# Patient Record
Sex: Male | Born: 1950
Health system: Southern US, Community
[De-identification: ages and names within clinical notes are randomized; demographics above are authoritative.]

## PROBLEM LIST (undated history)

## (undated) DIAGNOSIS — E119 Type 2 diabetes mellitus without complications: Secondary | ICD-10-CM

## (undated) DIAGNOSIS — N189 Chronic kidney disease, unspecified: Secondary | ICD-10-CM

## (undated) DIAGNOSIS — M1990 Unspecified osteoarthritis, unspecified site: Secondary | ICD-10-CM

## (undated) DIAGNOSIS — Z8042 Family history of malignant neoplasm of prostate: Secondary | ICD-10-CM

## (undated) DIAGNOSIS — Z973 Presence of spectacles and contact lenses: Secondary | ICD-10-CM

## (undated) DIAGNOSIS — E785 Hyperlipidemia, unspecified: Secondary | ICD-10-CM

## (undated) DIAGNOSIS — R569 Unspecified convulsions: Secondary | ICD-10-CM

## (undated) DIAGNOSIS — K219 Gastro-esophageal reflux disease without esophagitis: Secondary | ICD-10-CM

## (undated) DIAGNOSIS — M722 Plantar fascial fibromatosis: Secondary | ICD-10-CM

## (undated) DIAGNOSIS — R339 Retention of urine, unspecified: Secondary | ICD-10-CM

## (undated) DIAGNOSIS — C61 Malignant neoplasm of prostate: Secondary | ICD-10-CM

## (undated) DIAGNOSIS — Z8489 Family history of other specified conditions: Secondary | ICD-10-CM

## (undated) DIAGNOSIS — E781 Pure hyperglyceridemia: Secondary | ICD-10-CM

## (undated) HISTORY — DX: Type 2 diabetes mellitus without complications: E11.9

## (undated) HISTORY — PX: COLONOSCOPY: SHX174

## (undated) HISTORY — DX: Family history of malignant neoplasm of prostate: Z80.42

## (undated) HISTORY — PX: TONSILLECTOMY: SUR1361

---

## 1986-04-19 HISTORY — PX: POLYPECTOMY: SHX149

## 2008-11-06 ENCOUNTER — Ambulatory Visit: Payer: Self-pay | Admitting: Thoracic Surgery

## 2008-11-17 HISTORY — PX: BRONCHOSCOPY: SUR163

## 2008-11-19 ENCOUNTER — Ambulatory Visit: Payer: Self-pay | Admitting: Thoracic Surgery

## 2008-11-19 ENCOUNTER — Ambulatory Visit (HOSPITAL_COMMUNITY): Admission: RE | Admit: 2008-11-19 | Discharge: 2008-11-19 | Payer: Self-pay | Admitting: Thoracic Surgery

## 2008-11-19 ENCOUNTER — Encounter: Payer: Self-pay | Admitting: Thoracic Surgery

## 2010-07-25 LAB — APTT: aPTT: 30 seconds (ref 24–37)

## 2010-07-25 LAB — COMPREHENSIVE METABOLIC PANEL
AST: 28 U/L (ref 0–37)
Albumin: 3.9 g/dL (ref 3.5–5.2)
Alkaline Phosphatase: 81 U/L (ref 39–117)
Chloride: 103 mEq/L (ref 96–112)
GFR calc Af Amer: >60 mL/min (ref >60)
Potassium: 5 mEq/L (ref 3.5–5.1)
Sodium: 140 mEq/L (ref 135–145)
Total Bilirubin: 0.6 mg/dL (ref 0.3–1.2)
Total Protein: 6.8 g/dL (ref 6.0–8.3)

## 2010-07-25 LAB — CULTURE, RESPIRATORY W GRAM STAIN

## 2010-07-25 LAB — CBC
Platelets: 198 10*3/uL (ref 150–400)
RDW: 12.1 % (ref 11.5–15.5)
WBC: 6.2 10*3/uL (ref 4.0–10.5)

## 2010-09-01 NOTE — Op Note (Signed)
[name], [name]             ACCOUNT NO.:  [id number]   MEDICAL RECORD NO.:  [id number]          PATIENT TYPE:  AMB   LOCATION:  SDS                          FACILITY:  [facility]   PHYSICIAN:  Hermelinda Medicus, M.D.   DATE OF BIRTH:  08-30-1950   DATE OF PROCEDURE:  DATE OF DISCHARGE:  11/19/2008                               OPERATIVE REPORT   PREOPERATIVE DIAGNOSES:  1. Possible true vocal cord lesion.  2. History of vocal cord polyp in 1988.  3. History of voice abuse.  4. History of hoarseness with hemoptysis episode.   POSTOPERATIVE DIAGNOSES:  1. Possible true vocal cord lesion.  2. History of vocal cord polyp in 1988.  3. History of voice abuse.  4. History of hoarseness with hemoptysis episode with findings of      anterior commissure hemorrhagic polyp, right anterior commissure      true vocal cord.   OPERATOR:  Hermelinda Medicus, MD   PROCEDURES:  1. Microlaryngoscopy.  2. True vocal cord biopsy excision.   ANESTHESIA:  General endotracheal with Dr. Katrinka Blazing.   PROCEDURE:  The patient was placed in the supine position under general  endotracheal anesthesia.  Using a small 6 endotracheal tube, the patient  was positioned and draped appropriately and then using the high-power  microscope as well as the Jako laryngoscope, the entire larynx was  evaluated.  We then focused down to the vocal cords, the area of  definite interest and placed a Lewy suspension and after evaluating the  posterior aspect of the vocal cords and the piriformis and the laryngeal  end lingual surface of the epiglottis, removed down to the vocal cords,  then subglottic region posteriorly looked within normal limits, but  anteriorly we were able to manipulate the larynx up to seeing the  anterior commissure completely where there was a small polyp  approximately 2 mm to 3 mm in size, which we excised and biopsy excised  that was quite hemorrhagic and went very well with caused the area of  hemoptysis.  Once this was completed, the scope was then carefully  removed.  Lateral pharyngeal walls and posterior pharyngeal walls, base  of tongue, and oral cavity were all clear of any lesion or mass.  Then,  the procedure was continued where continuing with this intubation, Dr.  Edwyna Shell used the flexible bronchoscope to evaluate the  lungs, which will be established in his dictation.  His follow up will  be in 4 days, then 2 weeks, then 6 weeks, and 6 months.  In 6 months, he  is going to need another CT scan of his chest just to checkout that  right lower lobe superior aspect to 5 mm lesion.           ______________________________  Hermelinda Medicus, M.D.     JC/MEDQ  D:  11/19/2008  T:  11/19/2008  Job:  [id number]   cc:   Ines Bloomer, M.D.

## 2010-09-01 NOTE — H&P (Signed)
[name], [name]             ACCOUNT NO.:  [id number]   MEDICAL RECORD NO.:  [id number]          PATIENT TYPE:  AMB   LOCATION:  SDS                          FACILITY:  [facility]   PHYSICIAN:  [name], M.D.   DATE OF BIRTH:  11-18-1950   DATE OF ADMISSION:  11/19/2008  DATE OF DISCHARGE:  11/19/2008                              HISTORY & PHYSICAL   This patient is a 60 year old male who had some hoarseness.  He has been  a Runner, broadcasting/film/video for many years.  He had a polyp removed under my care in 1988,  from his vocal cord and more recently he has come into the office with a  hoarseness once again.  He, on examination using indirect laryngoscopy  and the [facility] scope, we did not see any obvious concern, but  anteriorly it was very difficult to see and also there was concern and  there seemed to be some slight swelling in that area.  We felt we would  observe him, he did, then go to [place] where he had an episode of  hemoptysis and there was no bleeding from his nasopharynx, oropharynx,  or his nose.  He did not vomit any blood.  He does have thus spit up  some blood and then no further problem.  He did go to the emergency room  where they just told him to be further evaluated in [place].  Here,  he has a normal chest x-ray, but we ordered a CAT scan of his chest,  which showed lesion of 5 mm in the superior aspect, right lower lobe and  therefore we felt that with this we would do a direct laryngoscopy,  microlaryngoscopy, and a bronchoscopy with myself and Dr. Jovita Richardson.   PAST MEDICAL HISTORY:  1. He has hypercholesterolemia.  2. He has had a seizure disorder in the past.   He takes phenobarbital, Tri-Chlor, opiate, simvastatin, folic acid, baby  aspirin 81, Avandia twice a day, and a Prilosec.   He has had problems with reflux, but appears to be well under control.   FAMILY HISTORY:  Unremarkable.   ALLERGIES:  He has no allergies to medication.   SOCIAL HISTORY:   Married, has 2 children.  He is on moving to [employer] as  a Runner, broadcasting/film/video.   REVIEW OF SYSTEMS:  Unremarkable.  Otherwise, he had no past bleeding  problems or coagulation problems.   PHYSICAL EXAMINATION:  VITAL SIGNS:  Blood pressure of 111/74.  The  pulse is 88.  HEENT:  The ears are clear.  The tympanic membranes look excellent and  nose is completely clear as is the nasopharynx.  He has no evidence of  sinusitis, polyps, or mass.  The oral cavity is free also of any  ulceration or mass.  The larynx appears to have some thickening of his  vocal cords with some mild erythema typical of a person who has overused  his voice for many years, but no obvious masses.  However, the anterior  commissure is very difficult to see.  CHEST:  Clear.  No rales, rhonchi, or wheezes.  CARDIOVASCULAR:  Normal S1, S2.  No murmurs or gallops.  EXTREMITIES:  Unremarkable.   Initial diagnosis is that of possible anterior commissure or bronchial  lesion causing hemoptysis.  We will plan to do a microlaryngoscopy and  possible biopsy excision with also a flexible bronchoscopy as a combined  procedure.  We will also get a follow up CT scan in 6 months to check up  at right lower lobe.   OUR DIAGNOSES:  1. Possible lesion, anterior vocal cord.  2. Possible lesion, right lower lobe, superior aspect.  3. History of voice abuse.  4. History of seizure.  5. History of reflux esophagitis.  6. Cholesterolemia.           ______________________________  [name], M.D.     JC/MEDQ  D:  11/19/2008  T:  11/19/2008  Job:  [id number]

## 2010-09-01 NOTE — H&P (Signed)
NAMEMORDCHE, HEDGLIN             ACCOUNT NO.:  [id number]   MEDICAL RECORD NO.:  [id number]          PATIENT TYPE:  AMB   LOCATION:  SDS                          FACILITY:  [facility]   PHYSICIAN:  Hermelinda Medicus, M.D.   DATE OF BIRTH:  03-13-1951   DATE OF ADMISSION:  11/19/2008  DATE OF DISCHARGE:  11/19/2008                              HISTORY & PHYSICAL   He is under Dr. Karle Plumber and my care.   This patient is a 60 year old male who entered my office with some mild  hoarseness.  He has been a Runner, broadcasting/film/video for many many years.  He in 1988 had  a polyp removed under my care and he has not had any other problems.  He, however, after examination    DICTATION ENDED AT THIS POINT           ______________________________  Hermelinda Medicus, M.D.     JC/MEDQ  D:  11/19/2008  T:  11/19/2008  Job:  [id number]

## 2010-09-01 NOTE — Letter (Signed)
November 06, 2008   Hermelinda Medicus, MD  [address], [place] [address]   Re:  KYE, HEDDEN             DOB:  December 05, 1950   Dear Dr. Haroldine Laws:   I saw the patient today.  This patient in 1988 had a removal of a polyp  for some problems with a voice hoarseness.  He now seems to have in last  6 months to have more problems with hoarseness in the voice.  He works  as a Runner, broadcasting/film/video.  He was recently at a conference at [facility] where he had  an episode of possible hemoptysis versus some bleeding in his  nasopharynx.  There was no epistaxis and no hematemesis.  It cleared  easily, although he went to the emergency room and nothing was found.  He had a CT scan done at [facility], which showed a 5-mm right lower  lobe nodule with no other lesions and referred here for evaluation.   PAST MEDICAL HISTORY:  Significant for hypercholesterolemia, and he has  had a seizure disorder.   MEDICATIONS:  He takes phenobarbital 190 mg a day, TriCor, Optinate,  simvastatin, folic acid, baby aspirin, Avandia twice a day, and  Prilosec.  He has had problems with reflux.   FAMILY HISTORY:  Noncontributory.   ALLERGIES:  He has no allergies.   SOCIAL HISTORY:  He is married.  He has two children.  He works as a  Engineer, site.  He does not smoke or drink.   REVIEW OF SYSTEMS:  He is 6 feet 2.  He is 210 pounds.  General:  His  weight has been stable.  Cardiac:  No angina or atrial fibrillation.  Pulmonary:  See history of present illness.  GI:  Reflux and  constipation.  GU:  Frequent urination.  Vascular:  No claudication,  DVT, or TIAs.  Neurological:  Seizures.  No headaches or blackouts.  Musculoskeletal:  No arthritis or joint pain.  Psychiatric:  No  depression or nervousness.  Eyes/ENT:  No changes in his eyesight or  hearing.  Hematological:  No problems with bleeding, clotting disorders,  or anemia.   PHYSICAL EXAMINATION:  GENERAL:  He is a well-developed Caucasian male  in no  acute distress.  VITAL SIGNS:  His blood pressure was 124/64, pulse 88, respirations 20,  and saturations were 97%.  HEAD, EYES, EARS, NOSE, AND THROAT:  Unremarkable.  NECK:  Supple without thyromegaly.  There is no supraclavicular or  axillary adenopathy.  CHEST:  Clear to auscultation and percussion.  HEART:  Regular sinus rhythm.  No murmur.  ABDOMEN:  Soft.  There is no hepatosplenomegaly.  EXTREMITIES:  Pulses are 2+.  There is no clubbing or edema.  NEUROLOGICAL:  He is oriented x3.  Sensory and motor intact.   I am not sure whether this is a hemoptysis or bleeding from the pharynx.  I think given that it was a fairly significant hemoptysis, he probably  needs to have evaluation with an microlaryngoscopy and bronchoscopy.  I  have discussed this with he and his wife and they seemed agreeable to  this.  We will try to set this up after November 18, 2008.  I appreciate  the opportunity of seeing the patient.  I do recommend that he get a  followup CT scan for the 5-mm right lower lobe nodule in 6 months.   Sincerely,   Ines Bloomer, M.D.  Electronically  Signed   DPB/MEDQ  D:  11/06/2008  T:  11/07/2008  Job:  [id number]

## 2010-09-01 NOTE — Op Note (Signed)
NAMETHARON, BOMAR             ACCOUNT NO.:  [id number]   MEDICAL RECORD NO.:  [id number]          PATIENT TYPE:  AMB   LOCATION:  SDS                          FACILITY:  [facility]   PHYSICIAN:  Ines Bloomer, M.D. DATE OF BIRTH:  1951-02-24   DATE OF PROCEDURE:  DATE OF DISCHARGE:                               OPERATIVE REPORT   SURGEON:  Ines Bloomer, MD   OPERATION PERFORMED:  Fiberoptic bronchoscopy.   POSTOPERATIVE DIAGNOSIS:  Fiberoptic bronchoscopy.   ANESTHESIA:  General anesthesia.   A small 0.5 tube was passed through the endotracheal tube and Dr.  Haroldine Laws did a direct laryngoscopy with biopsy of an anterior commissure  polyp.  He will dictate that separately.  After this had been completed,  the 6-mm video bronchoscope was passed right next to the tube and then  reinflated after the scope had been passed, the distal trachea was  normal, the carina midline was normal.  The right upper lobe, right  middle lobe, and right lower lobe orifices were normal.  On the right  lower lobe where they had been some questionable infiltrate on the CT  scan that was completely normal particularly in the superior segment.  On the left side, the left mainstem bronchus was normal.  The left upper  lobe and left lower lobe orifices were normal.  No endobronchial lesion  was seen.  The cultures and cytologies were taken.  The video  bronchoscope was removed.  The patient tolerated procedure well, was  returned to recovery room in stable condition.      Ines Bloomer, M.D.  Electronically Signed     DPB/MEDQ  D:  11/19/2008  T:  11/19/2008  Job:  [id number]

## 2010-12-04 ENCOUNTER — Other Ambulatory Visit: Payer: Self-pay | Admitting: Rheumatology

## 2010-12-04 DIAGNOSIS — M25562 Pain in left knee: Secondary | ICD-10-CM

## 2010-12-05 ENCOUNTER — Ambulatory Visit
Admission: RE | Admit: 2010-12-05 | Discharge: 2010-12-05 | Disposition: A | Discharge status: Home / Self Care | Payer: BC Managed Care – PPO | Source: Ambulatory Visit | Attending: Rheumatology | Admitting: Rheumatology

## 2010-12-05 DIAGNOSIS — M25562 Pain in left knee: Secondary | ICD-10-CM

## 2011-08-18 HISTORY — PX: KNEE ARTHROSCOPY: SHX127

## 2012-07-24 ENCOUNTER — Ambulatory Visit: Payer: Self-pay | Admitting: Neurology

## 2012-08-15 ENCOUNTER — Telehealth: Payer: Self-pay | Admitting: Neurology

## 2012-08-15 ENCOUNTER — Other Ambulatory Visit: Payer: Self-pay

## 2012-08-15 MED ORDER — PHENOBARBITAL 64.8 MG PO TABS: 194.4000 mg | ORAL_TABLET | Freq: Every day | ORAL | Status: DC

## 2012-08-15 NOTE — Telephone Encounter (Signed)
Former Love patient assigned to Dr Yan  

## 2012-08-15 NOTE — Telephone Encounter (Signed)
His pharmacist called regarding a refill on his phenobarbital prescription. The patient is a former patient of Dr. love has an appointment with our office in June. He is running out of his medication and apparently was ordered by Dr. Terrace Arabia but the pharmacist did not receive it. I gave a verbal order for his phenobarbital prescription, one month supply with 2 refills to cover him until his appointment.

## 2012-09-04 ENCOUNTER — Other Ambulatory Visit: Payer: Self-pay | Admitting: Orthopedic Surgery

## 2012-09-26 ENCOUNTER — Encounter (HOSPITAL_BASED_OUTPATIENT_CLINIC_OR_DEPARTMENT_OTHER): Payer: Self-pay | Admitting: *Deleted

## 2012-09-26 NOTE — Progress Notes (Signed)
No cardiac or resp problems-no labs needed 

## 2012-10-02 ENCOUNTER — Encounter (HOSPITAL_BASED_OUTPATIENT_CLINIC_OR_DEPARTMENT_OTHER)
Admission: RE | Disposition: A | Discharge status: Home / Self Care | Payer: Self-pay | Source: Ambulatory Visit | Attending: Orthopedic Surgery

## 2012-10-02 ENCOUNTER — Ambulatory Visit (HOSPITAL_BASED_OUTPATIENT_CLINIC_OR_DEPARTMENT_OTHER): Payer: BC Managed Care – PPO | Admitting: Anesthesiology

## 2012-10-02 ENCOUNTER — Encounter (HOSPITAL_BASED_OUTPATIENT_CLINIC_OR_DEPARTMENT_OTHER): Payer: Self-pay | Admitting: Anesthesiology

## 2012-10-02 ENCOUNTER — Ambulatory Visit (HOSPITAL_BASED_OUTPATIENT_CLINIC_OR_DEPARTMENT_OTHER)
Admission: RE | Admit: 2012-10-02 | Discharge: 2012-10-02 | Disposition: A | Discharge status: Home / Self Care | Payer: BC Managed Care – PPO | Source: Ambulatory Visit | Attending: Orthopedic Surgery | Admitting: Orthopedic Surgery

## 2012-10-02 ENCOUNTER — Encounter (HOSPITAL_BASED_OUTPATIENT_CLINIC_OR_DEPARTMENT_OTHER): Payer: Self-pay | Admitting: *Deleted

## 2012-10-02 DIAGNOSIS — M7502 Adhesive capsulitis of left shoulder: Secondary | ICD-10-CM

## 2012-10-02 DIAGNOSIS — E785 Hyperlipidemia, unspecified: Secondary | ICD-10-CM | POA: Insufficient documentation

## 2012-10-02 DIAGNOSIS — M719 Bursopathy, unspecified: Secondary | ICD-10-CM | POA: Insufficient documentation

## 2012-10-02 DIAGNOSIS — K219 Gastro-esophageal reflux disease without esophagitis: Secondary | ICD-10-CM | POA: Insufficient documentation

## 2012-10-02 DIAGNOSIS — M7500 Adhesive capsulitis of unspecified shoulder: Secondary | ICD-10-CM | POA: Insufficient documentation

## 2012-10-02 DIAGNOSIS — M67919 Unspecified disorder of synovium and tendon, unspecified shoulder: Secondary | ICD-10-CM | POA: Insufficient documentation

## 2012-10-02 DIAGNOSIS — M66329 Spontaneous rupture of flexor tendons, unspecified upper arm: Secondary | ICD-10-CM | POA: Insufficient documentation

## 2012-10-02 DIAGNOSIS — M129 Arthropathy, unspecified: Secondary | ICD-10-CM | POA: Insufficient documentation

## 2012-10-02 HISTORY — DX: Unspecified osteoarthritis, unspecified site: M19.90

## 2012-10-02 HISTORY — DX: Pure hyperglyceridemia: E78.1

## 2012-10-02 HISTORY — DX: Presence of spectacles and contact lenses: Z97.3

## 2012-10-02 HISTORY — DX: Unspecified convulsions: R56.9

## 2012-10-02 HISTORY — DX: Gastro-esophageal reflux disease without esophagitis: K21.9

## 2012-10-02 HISTORY — DX: Hyperlipidemia, unspecified: E78.5

## 2012-10-02 HISTORY — PX: CAPSULAR RELEASE: SHX6293

## 2012-10-02 LAB — POCT HEMOGLOBIN-HEMACUE: Hemoglobin: 15.5 g/dL (ref 13.0–17.0)

## 2012-10-02 SURGERY — CAPSULAR RELEASE
Anesthesia: Regional | Site: Shoulder | Laterality: Left | Wound class: Clean

## 2012-10-02 MED ORDER — OXYCODONE HCL 5 MG/5ML PO SOLN: 5.0000 mg | Freq: Once | ORAL | Status: DC | PRN

## 2012-10-02 MED ORDER — SUCCINYLCHOLINE CHLORIDE 20 MG/ML IJ SOLN
INTRAMUSCULAR | Status: DC | PRN
  Administered 2012-10-02: 100 mg via INTRAVENOUS

## 2012-10-02 MED ORDER — FENTANYL CITRATE 0.05 MG/ML IJ SOLN
50.0000 ug | INTRAMUSCULAR | Status: DC | PRN
  Administered 2012-10-02: 100 ug via INTRAVENOUS

## 2012-10-02 MED ORDER — ONDANSETRON HCL 4 MG/2ML IJ SOLN
INTRAMUSCULAR | Status: DC | PRN
  Administered 2012-10-02: 4 mg via INTRAVENOUS

## 2012-10-02 MED ORDER — BUPIVACAINE-EPINEPHRINE PF 0.5-1:200000 % IJ SOLN
INTRAMUSCULAR | Status: DC | PRN
  Administered 2012-10-02: 30 mL

## 2012-10-02 MED ORDER — PROPOFOL 10 MG/ML IV BOLUS
INTRAVENOUS | Status: DC | PRN
  Administered 2012-10-02: 200 mg via INTRAVENOUS

## 2012-10-02 MED ORDER — LIDOCAINE HCL 4 % MT SOLN
OROMUCOSAL | Status: DC | PRN
  Administered 2012-10-02: 4 mL via TOPICAL

## 2012-10-02 MED ORDER — ONDANSETRON HCL 4 MG/2ML IJ SOLN: 4.0000 mg | Freq: Four times a day (QID) | INTRAMUSCULAR | Status: DC | PRN

## 2012-10-02 MED ORDER — SODIUM CHLORIDE 0.9 % IR SOLN
Status: DC | PRN
  Administered 2012-10-02: 6000 mL

## 2012-10-02 MED ORDER — HYDROMORPHONE HCL PF 1 MG/ML IJ SOLN: 0.2500 mg | INTRAMUSCULAR | Status: DC | PRN

## 2012-10-02 MED ORDER — MIDAZOLAM HCL 2 MG/2ML IJ SOLN
1.0000 mg | INTRAMUSCULAR | Status: DC | PRN
  Administered 2012-10-02: 2 mg via INTRAVENOUS

## 2012-10-02 MED ORDER — OXYCODONE HCL 5 MG PO TABS: 5.0000 mg | ORAL_TABLET | Freq: Once | ORAL | Status: DC | PRN

## 2012-10-02 MED ORDER — OXYCODONE-ACETAMINOPHEN 5-325 MG PO TABS: 1.0000 | ORAL_TABLET | ORAL | Status: DC | PRN

## 2012-10-02 MED ORDER — FENTANYL CITRATE 0.05 MG/ML IJ SOLN: 50.0000 ug | INTRAMUSCULAR | Status: DC | PRN

## 2012-10-02 MED ORDER — MIDAZOLAM HCL 2 MG/2ML IJ SOLN: 1.0000 mg | INTRAMUSCULAR | Status: DC | PRN

## 2012-10-02 MED ORDER — LIDOCAINE HCL (CARDIAC) 20 MG/ML IV SOLN
INTRAVENOUS | Status: DC | PRN
  Administered 2012-10-02: 80 mg via INTRAVENOUS

## 2012-10-02 MED ORDER — DEXAMETHASONE SODIUM PHOSPHATE 4 MG/ML IJ SOLN
INTRAMUSCULAR | Status: DC | PRN
  Administered 2012-10-02: 10 mg via INTRAVENOUS

## 2012-10-02 MED ORDER — LACTATED RINGERS IV SOLN
INTRAVENOUS | Status: DC
  Administered 2012-10-02 (×2): via INTRAVENOUS

## 2012-10-02 MED ORDER — CEFAZOLIN SODIUM-DEXTROSE 2-3 GM-% IV SOLR
2.0000 g | INTRAVENOUS | Status: AC
  Administered 2012-10-02: 2 g via INTRAVENOUS

## 2012-10-02 MED ORDER — POVIDONE-IODINE 7.5 % EX SOLN: Freq: Once | CUTANEOUS | Status: DC

## 2012-10-02 SURGICAL SUPPLY — 89 items
ADH SKN CLS APL DERMABOND .7 (GAUZE/BANDAGES/DRESSINGS)
APL SKNCLS STERI-STRIP NONHPOA (GAUZE/BANDAGES/DRESSINGS)
BENZOIN TINCTURE PRP APPL 2/3 (GAUZE/BANDAGES/DRESSINGS) IMPLANT
BLADE 4.2CUDA (BLADE) IMPLANT
BLADE CUDA 5.5 (BLADE) IMPLANT
BLADE CUTTER GATOR 3.5 (BLADE) IMPLANT
BLADE GREAT WHITE 4.2 (BLADE) IMPLANT
BLADE SURG 15 STRL LF DISP TIS (BLADE) IMPLANT
BLADE SURG 15 STRL SS (BLADE)
BLADE SURG ROTATE 9660 (MISCELLANEOUS) ×2 IMPLANT
BLADE VORTEX 6.0 (BLADE) IMPLANT
BUR 3.5 LG SPHERICAL (BURR) IMPLANT
BUR OVAL 4.0 (BURR) IMPLANT
BUR OVAL 6.0 (BURR) IMPLANT
BURR 3.5 LG SPHERICAL (BURR)
CANISTER OMNI JUG 16 LITER (MISCELLANEOUS) IMPLANT
CANISTER SUCTION 2500CC (MISCELLANEOUS) IMPLANT
CANNULA 5.75X71 LONG (CANNULA) ×2 IMPLANT
CANNULA TWIST IN 8.25X7CM (CANNULA) IMPLANT
CHLORAPREP W/TINT 26ML (MISCELLANEOUS) ×2 IMPLANT
CLOTH BEACON ORANGE TIMEOUT ST (SAFETY) ×2 IMPLANT
DECANTER SPIKE VIAL GLASS SM (MISCELLANEOUS) IMPLANT
DERMABOND ADVANCED (GAUZE/BANDAGES/DRESSINGS)
DERMABOND ADVANCED .7 DNX12 (GAUZE/BANDAGES/DRESSINGS) IMPLANT
DRAPE INCISE IOBAN 66X45 STRL (DRAPES) ×2 IMPLANT
DRAPE STERI 35X30 U-POUCH (DRAPES) ×2 IMPLANT
DRAPE SURG 17X23 STRL (DRAPES) ×2 IMPLANT
DRAPE U 20/CS (DRAPES) ×2 IMPLANT
DRAPE U-SHAPE 47X51 STRL (DRAPES) ×2 IMPLANT
DRAPE U-SHAPE 76X120 STRL (DRAPES) ×4 IMPLANT
DRSG PAD ABDOMINAL 8X10 ST (GAUZE/BANDAGES/DRESSINGS) ×2 IMPLANT
ELECT REM PT RETURN 9FT ADLT (ELECTROSURGICAL) ×2
ELECTRODE REM PT RTRN 9FT ADLT (ELECTROSURGICAL) ×1 IMPLANT
GAUZE SPONGE 4X4 16PLY XRAY LF (GAUZE/BANDAGES/DRESSINGS) IMPLANT
GAUZE XEROFORM 1X8 LF (GAUZE/BANDAGES/DRESSINGS) ×2 IMPLANT
GLOVE BIO SURGEON STRL SZ 6.5 (GLOVE) ×1 IMPLANT
GLOVE BIO SURGEON STRL SZ7 (GLOVE) ×2 IMPLANT
GLOVE BIO SURGEON STRL SZ7.5 (GLOVE) ×2 IMPLANT
GLOVE BIOGEL PI IND STRL 7.0 (GLOVE) ×1 IMPLANT
GLOVE BIOGEL PI IND STRL 8 (GLOVE) ×2 IMPLANT
GLOVE BIOGEL PI INDICATOR 7.0 (GLOVE) ×2
GLOVE BIOGEL PI INDICATOR 8 (GLOVE) ×1
GLOVE EXAM NITRILE LRG STRL (GLOVE) ×2 IMPLANT
GOWN PREVENTION PLUS XLARGE (GOWN DISPOSABLE) ×3 IMPLANT
NDL SCORPION MULTI FIRE (NEEDLE) IMPLANT
NDL SUT 6 .5 CRC .975X.05 MAYO (NEEDLE) IMPLANT
NEEDLE 1/2 CIR CATGUT .05X1.09 (NEEDLE) IMPLANT
NEEDLE MAYO TAPER (NEEDLE)
NEEDLE SCORPION MULTI FIRE (NEEDLE) IMPLANT
NS IRRIG 1000ML POUR BTL (IV SOLUTION) IMPLANT
PACK ARTHROSCOPY DSU (CUSTOM PROCEDURE TRAY) ×2 IMPLANT
PACK BASIN DAY SURGERY FS (CUSTOM PROCEDURE TRAY) ×2 IMPLANT
PENCIL BUTTON HOLSTER BLD 10FT (ELECTRODE) IMPLANT
RESECTOR FULL RADIUS 4.2MM (BLADE) ×2 IMPLANT
RESECTOR FULL RADIUS 4.8MM (BLADE) IMPLANT
SHEET MEDIUM DRAPE 40X70 STRL (DRAPES) IMPLANT
SLEEVE SCD COMPRESS KNEE MED (MISCELLANEOUS) ×2 IMPLANT
SLING ARM FOAM STRAP LRG (SOFTGOODS) ×1 IMPLANT
SLING ARM FOAM STRAP MED (SOFTGOODS) IMPLANT
SLING ARM FOAM STRAP XLG (SOFTGOODS) IMPLANT
SLING ARM IMMOBILIZER MED (SOFTGOODS) IMPLANT
SPONGE GAUZE 4X4 12PLY (GAUZE/BANDAGES/DRESSINGS) ×2 IMPLANT
SPONGE LAP 4X18 X RAY DECT (DISPOSABLE) IMPLANT
STRIP CLOSURE SKIN 1/2X4 (GAUZE/BANDAGES/DRESSINGS) IMPLANT
SUCTION FRAZIER TIP 10 FR DISP (SUCTIONS) IMPLANT
SUPPORT WRAP ARM LG (MISCELLANEOUS) ×2 IMPLANT
SUT BONE WAX W31G (SUTURE) IMPLANT
SUT ETHIBOND 2 OS 4 DA (SUTURE) IMPLANT
SUT ETHILON 3 0 PS 1 (SUTURE) ×2 IMPLANT
SUT ETHILON 4 0 PS 2 18 (SUTURE) IMPLANT
SUT FIBERWIRE #2 38 T-5 BLUE (SUTURE)
SUT FIBERWIRE 2-0 18 17.9 3/8 (SUTURE)
SUT MNCRL AB 3-0 PS2 18 (SUTURE) IMPLANT
SUT MNCRL AB 4-0 PS2 18 (SUTURE) IMPLANT
SUT PDS AB 0 CT 36 (SUTURE) IMPLANT
SUT PROLENE 3 0 PS 2 (SUTURE) IMPLANT
SUT VIC AB 0 CT1 18XCR BRD 8 (SUTURE) IMPLANT
SUT VIC AB 0 CT1 8-18 (SUTURE)
SUT VIC AB 2-0 SH 18 (SUTURE) IMPLANT
SUTURE FIBERWR #2 38 T-5 BLUE (SUTURE) IMPLANT
SUTURE FIBERWR 2-0 18 17.9 3/8 (SUTURE) IMPLANT
SYR BULB 3OZ (MISCELLANEOUS) IMPLANT
TOWEL OR 17X24 6PK STRL BLUE (TOWEL DISPOSABLE) ×2 IMPLANT
TOWEL OR NON WOVEN STRL DISP B (DISPOSABLE) ×2 IMPLANT
TUBE CONNECTING 20X1/4 (TUBING) ×4 IMPLANT
TUBING ARTHROSCOPY IRRIG 16FT (MISCELLANEOUS) ×2 IMPLANT
WAND STAR VAC 90 (SURGICAL WAND) ×2 IMPLANT
WATER STERILE IRR 1000ML POUR (IV SOLUTION) ×2 IMPLANT
YANKAUER SUCT BULB TIP NO VENT (SUCTIONS) IMPLANT

## 2012-10-02 NOTE — Progress Notes (Signed)
Assisted Dr. Hodierne with left, ultrasound guided, interscalene  block. Side rails up, monitors on throughout procedure. See vital signs in flow sheet. Tolerated Procedure well. 

## 2012-10-02 NOTE — Anesthesia Preprocedure Evaluation (Signed)
Anesthesia Evaluation  Patient identified by MRN, date of birth, ID band Patient awake    Reviewed: Allergy & Precautions, H&P , NPO status , Patient's Chart, lab work & pertinent test results  Airway Mallampati: II  Neck ROM: full    Dental   Pulmonary          Cardiovascular     Neuro/Psych Seizures -,     GI/Hepatic GERD-  ,  Endo/Other    Renal/GU      Musculoskeletal  (+) Arthritis -,   Abdominal   Peds  Hematology   Anesthesia Other Findings   Reproductive/Obstetrics                           Anesthesia Physical Anesthesia Plan  ASA: II  Anesthesia Plan: General and Regional   Post-op Pain Management: MAC Combined w/ Regional for Post-op pain   Induction: Intravenous  Airway Management Planned: Oral ETT  Additional Equipment:   Intra-op Plan:   Post-operative Plan: Extubation in OR  Informed Consent: I have reviewed the patients History and Physical, chart, labs and discussed the procedure including the risks, benefits and alternatives for the proposed anesthesia with the patient or authorized representative who has indicated his/her understanding and acceptance.     Plan Discussed with: CRNA, Anesthesiologist and Surgeon  Anesthesia Plan Comments:         Anesthesia Quick Evaluation

## 2012-10-02 NOTE — Anesthesia Procedure Notes (Addendum)
Anesthesia Regional Block:  Interscalene brachial plexus block  Pre-Anesthetic Checklist: ,, timeout performed, Correct Patient, Correct Site, Correct Laterality, Correct Procedure, Correct Position, site marked, Risks and benefits discussed,  Surgical consent,  Pre-op evaluation,  At surgeon's request and post-op pain management  Laterality: Left  Prep: chloraprep       Needles:  Injection technique: Single-shot  Needle Type: Echogenic Stimulator Needle     Needle Length: 5cm 5 cm Needle Gauge: 22 and 22 G    Additional Needles:  Procedures: ultrasound guided (picture in chart) and nerve stimulator Interscalene brachial plexus block  Nerve Stimulator or Paresthesia:  Response: biceps flexion, 0.45 mA,   Additional Responses:   Narrative:  Start time: 10/02/2012 11:01 AM End time: 10/02/2012 11:12 AM Injection made incrementally with aspirations every 5 mL.  Performed by: Personally  Anesthesiologist: Dr Chaney Malling  Additional Notes: Functioning IV was confirmed and monitors were applied.  A 50mm 22ga Arrow echogenic stimulator needle was used. Sterile prep and drape,hand hygiene and sterile gloves were used.  Negative aspiration and negative test dose prior to incremental administration of local anesthetic. The patient tolerated the procedure well.  Ultrasound guidance: relevent anatomy identified, needle position confirmed, local anesthetic spread visualized around nerve(s), vascular puncture avoided.  Image printed for medical record.   Interscalene brachial plexus block Procedure Name: Intubation Date/Time: 10/02/2012 11:59 AM Performed by: Burna Cash Pre-anesthesia Checklist: Patient identified, Emergency Drugs available, Suction available and Patient being monitored Patient Re-evaluated:Patient Re-evaluated prior to inductionOxygen Delivery Method: Circle System Utilized Preoxygenation: Pre-oxygenation with 100% oxygen Intubation Type: IV induction Ventilation:  Mask ventilation without difficulty Laryngoscope Size: Mac and 3 Grade View: Grade I Tube type: Oral Number of attempts: 1 Airway Equipment and Method: stylet and oral airway Placement Confirmation: ETT inserted through vocal cords under direct vision,  positive ETCO2 and breath sounds checked- equal and bilateral Secured at: 24 cm Tube secured with: Tape Dental Injury: Teeth and Oropharynx as per pre-operative assessment

## 2012-10-02 NOTE — Op Note (Signed)
Procedure(s): LEFT ARTHROSCOPY CAPSULAR RELEASE Procedure Note  [name] male 62 y.o. 10/02/2012  Procedure(s) and Anesthesia Type:    * LEFT ARTHROSCOPY CAPSULAR RELEASE and manipulation under anesthesia - General with preoperative interscalene block #2 left shoulder arthroscopic long head biceps tenotomy  Surgeon(s) and Role:    * Mable Paris, MD - Primary     Surgeon: Mable Paris   Assistants: Damita Lack PA-C [facility] was present and scrubbed throughout the procedure and was essential in positioning, assisting with the camera and instrumentation,, and closure)  Anesthesia: General endotracheal anesthesia with preoperative interscalene block      Procedure Detail  LEFT ARTHROSCOPY CAPSULAR RELEASE  Estimated Blood Loss: Min         Drains: none  Blood Given: none         Specimens: none        Complications:  * No complications entered in OR log *         Disposition: PACU - hemodynamically stable.         Condition: stable    Procedure:   INDICATIONS FOR SURGERY: The patient is 62 y.o. male who has had a refractory left adhesive capsulitis which has failed conservative management for nearly one year with physical therapy. He wished to go forward with arthroscopic capsular release with manipulation or anesthesia.  OPERATIVE FINDINGS: Examination under anesthesia: He had severe stiffness with only about 60 forward flexion, 40 abduction. External rotation was 0. Internal rotation in abduction was 0. Diagnostic Arthroscopy:  Glenoid articular cartilage: Intact Humeral head articular cartilage: Diffusely thinned but intact Labrum: Intact, he had some extensive tearing of the long head biceps tendon just off the superior labrum which was severely inflamed. Therefore an arthroscopic biceps tenotomy was carried out. Loose bodies: None Synovitis: Severe Articular sided rotator cuff: Appears to be intact   DESCRIPTION OF  PROCEDURE: The patient was identified in preoperative  holding area where I personally marked the operative site after  verifying site, side, and procedure with the patient. An interscalene block was given by the attending anesthesiologist the holding area.  The patient was taken back to the operating room where general anesthesia was induced without complication and was placed in the beach-chair position with the back  elevated about 60 degrees and all extremities and head and neck carefully padded and  positioned.   The left upper extremity was then prepped and  draped in a standard sterile fashion. The appropriate time-out  procedure was carried out. The patient did receive IV antibiotics  within 30 minutes of incision.   A small posterior portal incision was made and the arthroscope was introduced into the joint. An anterior portal was then established above the subscapularis using needle localization. Small cannula was placed anteriorly. Diagnostic arthroscopy was then carried out with findings as described above.  He is noted to have severe synovitis throughout the shoulder. The shoulder was extremely tight and difficult to get instrumentation into the joint. After the anterior portal was established it was pulled back slightly and the ArthroCare was used to start the anterior rotator interval release. The biceps tendon was noted to be severely synovitic and partially torn. This was felt to be a continued pain generator and lead to further continued inflammation and tightness. Therefore the ArthroCare was used to perform a release of the long head biceps tendon off of the superior labrum. The interval release was carried anterior to the posterior aspect of the coracoid which was skeletonized  released the coracohumeral ligament. The upper border of the subscapularis was identified and released. Was initially encased in severe scar tissue. The middle glenohumeral ligament was then divided just  behind the subscapularis and the capsular release was carried down to approximately the 5:00 position using a biter and shaver to carefully state just outside the capsule. The arthroscope was then placed anteriorly and the posterior capsule was viewed. The ArthroCare was used to carry the capsular release up over the superior labrum and then down posteriorly. The biter was used to separate the labrum until the muscular fibers of the infraspinatus are visualized. I can only carry the posterior release down to about the 9:00 position safely under direct visualization. This point the arthroscopic equipment was removed from the joint and the manipulation was carried out. For flexion there was a satisfying release of the axillary pouch was again for flexion. External rotation also satisfying release. The stroke shoulder was also manipulated into abduction an abducted external and internal rotation. After taking the shoulder through these range of motions carefully 3 times final range of motion was approximately 165 for flexion, external rotation to about 80, abduction to 120. In 90 of abduction he can't about 85 of external rotation 70 internal rotation.  The arthroscopic equipment was removed from the joint and the portals were closed with 3-0 nylon in an interrupted fashion. Sterile dressings were then applied including Xeroform 4 x 4's ABDs and tape. The patient was then allowed to awaken from general anesthesia, placed in a sling, transferred to the stretcher and taken to the recovery room in stable condition.   POSTOPERATIVE PLAN: The patient will be discharged home today and will followup in one week for suture removal and wound check.  He'll start physical therapy tomorrow and will work on daily, hourly exercises to prevent this from scarring back in.

## 2012-10-02 NOTE — Anesthesia Postprocedure Evaluation (Signed)
  Anesthesia Post-op Note  Patient: [name]  Procedure(s) Performed: Procedure(s): LEFT ARTHROSCOPY CAPSULAR RELEASE (Left)  Patient Location: PACU  Anesthesia Type:GA combined with regional for post-op pain  Level of Consciousness: awake, alert  and oriented  Airway and Oxygen Therapy: Patient Spontanous Breathing  Post-op Pain: none  Post-op Assessment: Post-op Vital signs reviewed  Post-op Vital Signs: Reviewed  Complications: No apparent anesthesia complications

## 2012-10-02 NOTE — Transfer of Care (Signed)
Immediate Anesthesia Transfer of Care Note  Patient: [name]  Procedure(s) Performed: Procedure(s): LEFT ARTHROSCOPY CAPSULAR RELEASE (Left)  Patient Location: PACU  Anesthesia Type:GA combined with regional for post-op pain  Level of Consciousness: awake, alert  and oriented  Airway & Oxygen Therapy: Patient Spontanous Breathing and Patient connected to face mask oxygen  Post-op Assessment: Report given to PACU RN and Post -op Vital signs reviewed and stable  Post vital signs: Reviewed and stable  Complications: No apparent anesthesia complications

## 2012-10-02 NOTE — H&P (Signed)
[name] is an 62 y.o. male.   Chief Complaint: Left shoulder stiffness HPI: 62 year-old male with 11 month history of worsening left shoulder stiffness with adhesive capsulitis which has been refractory to conservative management with extensive physical therapy.  Past Medical History  Diagnosis Date  . GERD (gastroesophageal reflux disease)   . Seizures     started age 23-last one 69  . Hyperlipemia   . Hypertriglyceridemia   . Wears glasses   . Arthritis     Past Surgical History  Procedure Laterality Date  . Tonsillectomy    . Knee arthroscopy  5/13    lt  . Bronchoscopy  8/10    removal vocal cord polyp  . Polypectomy  1988    vocal cords  . Colonoscopy      History reviewed. No pertinent family history. Social History:  reports that he has never smoked. He does not have any smokeless tobacco history on file. He reports that he does not drink alcohol or use illicit drugs.  Allergies: No Known Allergies  Medications Prior to Admission  Medication Sig Dispense Refill  . aspirin 81 MG tablet Take 81 mg by mouth daily.      . calcium carbonate (OS-CAL) 600 MG TABS Take 600 mg by mouth 2 (two) times daily with a meal.      . Dexlansoprazole 30 MG capsule Take 30 mg by mouth daily.      . fenofibrate (TRICOR) 145 MG tablet Take 145 mg by mouth daily.      . folic acid (FOLVITE) 800 MCG tablet Take 400 mcg by mouth daily.      Marland Kitchen lubiprostone (AMITIZA) 24 MCG capsule Take 24 mcg by mouth 2 (two) times daily with a meal.      . PHENobarbital (LUMINAL) 64.8 MG tablet Take 3 tablets (194.4 mg total) by mouth at bedtime.  90 tablet  5  . simvastatin (ZOCOR) 20 MG tablet Take 20 mg by mouth daily.        Results for orders placed during the hospital encounter of 10/02/12 (from the past 48 hour(s))  POCT HEMOGLOBIN-HEMACUE     Status: None   Collection Time    10/02/12 10:31 AM      Result Value Range   Hemoglobin 15.5  13.0 - 17.0 g/dL   No results  found.  Review of Systems  All other systems reviewed and are negative.    Blood pressure 99/62, pulse 73, temperature 97.7 F (36.5 C), temperature source Oral, resp. rate 12, height 6' 1.5" (1.867 m), weight 95.255 kg (210 lb), SpO2 99.00%. Physical Exam  Constitutional: He is oriented to person, place, and time. He appears well-developed and well-nourished.  HENT:  Head: Atraumatic.  Eyes: EOM are normal.  Cardiovascular: Intact distal pulses.   Respiratory: Effort normal.  Musculoskeletal:       Right shoulder: He exhibits decreased range of motion.  Neurological: He is alert and oriented to person, place, and time.  Skin: Skin is warm and dry.  Psychiatric: He has a normal mood and affect.     Assessment/Plan Left refractory frozen shoulder Plan arthroscopic capsular release and manipulation under anesthesia Risks / benefits of surgery discussed Consent on chart  NPO for OR Preop antibiotics   Mayfield Schoene WILLIAM 10/02/2012, 11:40 AM

## 2012-10-03 ENCOUNTER — Encounter (HOSPITAL_BASED_OUTPATIENT_CLINIC_OR_DEPARTMENT_OTHER): Payer: Self-pay | Admitting: Orthopedic Surgery

## 2012-10-09 ENCOUNTER — Encounter: Payer: Self-pay | Admitting: Neurology

## 2012-10-09 ENCOUNTER — Ambulatory Visit (INDEPENDENT_AMBULATORY_CARE_PROVIDER_SITE_OTHER): Payer: BC Managed Care – PPO | Admitting: Neurology

## 2012-10-09 VITALS — BP 116/78 | HR 86 | Ht 70.5 in | Wt 211.0 lb

## 2012-10-09 DIAGNOSIS — M19012 Primary osteoarthritis, left shoulder: Secondary | ICD-10-CM

## 2012-10-09 DIAGNOSIS — R569 Unspecified convulsions: Secondary | ICD-10-CM | POA: Insufficient documentation

## 2012-10-09 DIAGNOSIS — M19019 Primary osteoarthritis, unspecified shoulder: Secondary | ICD-10-CM

## 2012-10-09 MED ORDER — LEVETIRACETAM ER 750 MG PO TB24: 750.0000 mg | ORAL_TABLET | Freq: Every evening | ORAL | Status: DC

## 2012-10-09 NOTE — Patient Instructions (Signed)
Phenobarbital 62.8mg   2 tabs now x 2 weeks, One tab x 2 weeks. 1/2 tab x 2 weeks.

## 2012-10-09 NOTE — Progress Notes (Signed)
History of Present Illness:    [name] is a 62 year old right-handed white married male from [place], [place], he has a history of generalized major motor seizures beginning in 1968. He was admitted to [facility] in 10/1966 with a cerebral concussion secondary to trauma and at that time he also had evidence of a seizure. He had a spinal tap with opening pressure 130 mm of water,clear colorless CSF,protein of 40-50 mg percent, 2 white blood cells present, SMA-12 and skull x-rays which were unremarkable. EEG was  abnormal showing  interictal transient activity suggestive of epilepsy and he was placed on phenobarbital 100 mg daily.  He had repeat EEGs  08/15/67, 08/06/1966, and 06/06/94, in all of which were normal. His seizures began at age 70 and  occurred in bed from midnight until 6 AM. He was evaluated for suspected attention deficit disorder in 1995. He was placed on Ritalin. He has had simple tics and benign essential tremor.   He denies macropsia, macropsia, strange odors or tastes. His last seizure was in the 1980s.   He has had DEXA scans, last one was in 09/21/04,  showing normal bone density except some osteopenia of the femoral neck. He has never had an MRI scan of the brain. He has not had any side effects from his phenobarbital. His last in this office in 08/02/2001 showed normal CBC,CMP with elevated SGPT of 48, and  phenobarbital 17.8.He is independent in his activities of daily living. he has stiffness in his knees from arthritis and Dupytren's contractures in his hands. He exercises by swimming or riding a bicycle. He is independent in activities of daily living.    He had left frozen shoulder, with limited range of motion of his left shoulder joints,  Review of Systems  Out of a complete 14 system review, the patient complains of only the following symptoms, and all other reviewed systems are negative.   Constitutional:   N/A Cardiovascular:  Swelling in  legs Ear/Nose/Throat:  N/A Skin: N/A Eyes: N/A Respiratory: N/A Gastroitestinal: constiptation    Hematology/Lymphatic:  N/A Endocrine:  N/A Musculoskeletal: joints pain, joint swelling Allergy/Immunology: N/A Neurological: seizure Psychiatric:    N/A   PHYSICAL EXAMINATOINS:  Generalized: In no acute distress  Neck: Supple, no carotid bruits   Cardiac: Regular rate rhythm  Pulmonary: Clear to auscultation bilaterally  Musculoskeletal: No deformity  Neurological examination  Mentation: Alert oriented to time, place, history taking, and causual conversation  Cranial nerve II-XII: Pupils were equal round reactive to light extraocular movements were full, visual field were full on confrontational test. facial sensation and strength were normal. hearing was intact to finger rubbing bilaterally. Uvula tongue midline.  head turning and shoulder shrug and were normal and symmetric.Tongue protrusion into cheek strength was normal.  Motor: limited range of motion of left shoulder, no significant weakness.  Sensory: mildly length dependent fine touch, pinprick to above ankle level, preserved toe vibratory sensation, and proprioception   Coordination: Normal finger to nose, heel-to-shin bilaterally there was no truncal ataxia  Gait: Rising up from seated position without assistance, normal stance, without trunk ataxia, moderate stride,   Romberg signs: Negative  Deep tendon reflexes: Brachioradialis 2/2, biceps 2/2, triceps 2/2, patellar 2/2, Achilles 1/1, plantar responses were flexor bilaterally.   Assessment and Plan: 62 years old Caucasian male, with past medical history of hyperlipidemia, skin cancer, nocturnal generalized seizure, is on long-standing phenobarbital treatment, developed mild length dependent sensory changes, he wants to try different antiepileptic  medications.  1. we will tapering off phenobarbital in next few weeks, 2. keppra xr 750mg  qhs. 3. RTC in one  year

## 2012-10-16 ENCOUNTER — Telehealth: Payer: Self-pay | Admitting: Neurology

## 2012-10-16 MED ORDER — PHENOBARBITAL 64.8 MG PO TABS: ORAL_TABLET | ORAL | Status: DC

## 2012-10-16 NOTE — Telephone Encounter (Signed)
OV says:  Patient Instructions    Phenobarbital 62.8mg   2 tabs now x 2 weeks,  One tab x 2 weeks.  1/2 tab x 2 weeks.   Rx has been updated and sent.

## 2013-10-09 ENCOUNTER — Ambulatory Visit (INDEPENDENT_AMBULATORY_CARE_PROVIDER_SITE_OTHER): Payer: BC Managed Care – PPO | Admitting: Neurology

## 2013-10-09 ENCOUNTER — Encounter: Payer: Self-pay | Admitting: Neurology

## 2013-10-09 VITALS — BP 120/81 | HR 79 | Ht 72.0 in | Wt 213.0 lb

## 2013-10-09 DIAGNOSIS — R569 Unspecified convulsions: Secondary | ICD-10-CM

## 2013-10-09 MED ORDER — LEVETIRACETAM ER 750 MG PO TB24
750.0000 mg | ORAL_TABLET | Freq: Every evening | ORAL | Status: DC
Start: 2013-10-09 — End: 2014-10-16

## 2013-10-09 NOTE — Progress Notes (Signed)
History of Present Illness:    [name] is a 63 year-old right-handed white married male from [place], [place], he has a history of generalized major motor seizures beginning in 1968. He was admitted to [facility] in 10/1966 with a cerebral concussion secondary to trauma and at that time he also had evidence of a seizure. He had a spinal tap with opening pressure 130 mm of water,clear colorless CSF,protein of 40-50 mg percent, 2 white blood cells present, SMA-12 and skull x-rays which were unremarkable. EEG was  abnormal showing  interictal transient activity suggestive of epilepsy and he was placed on phenobarbital 100 mg daily.  He had repeat EEGs  08/15/67, 08/06/1966, and 06/06/94, in all of which were normal. His seizures began at age 63 and  occurred in bed from midnight until 6 AM. He was evaluated for suspected attention deficit disorder in 1995. He was placed on Ritalin. He has had simple tics and benign essential tremor.   He denies macropsia, macropsia, strange odors or tastes. His last seizure was in the 1980s.   He has had DEXA scans, last one was in 09/21/04,  showing normal bone density except some osteopenia of the femoral neck. He has never had an MRI scan of the brain. He has not had any side effects from his phenobarbital. His last in this office in 08/02/2001 showed normal CBC,CMP with elevated SGPT of 48, and  phenobarbital 17.8.He is independent in his activities of daily living. he has stiffness in his knees from arthritis and Dupytren's contractures in his hands. He exercises by swimming or riding a bicycle. He is independent in activities of daily living.    He had left frozen shoulder, with limited range of motion of his left shoulder joints,  UPDATE June 23rd 2015: He was switched to [place] xr 750mg  qhs from phenobarbital,  last seizure was Feb 1968.  He has been on phenobarbital since 1968. He has a lifelong history of flatfeet, mild gait difficulty, no  significant change,   Review of Systems  Out of a complete 14 system review, the patient complains of only the following symptoms, and all other reviewed systems are negative.  Rectal bleeding, seizure, joint pain, joint swelling, aching muscles.   PHYSICAL EXAMINATOINS:  Generalized: In no acute distress  Neck: Supple, no carotid bruits   Cardiac: Regular rate rhythm  Pulmonary: Clear to auscultation bilaterally  Musculoskeletal: No deformity  Neurological examination  Mentation: Alert oriented to time, place, history taking, and causual conversation  Cranial nerve II-XII: Pupils were equal round reactive to light extraocular movements were full, visual field were full on confrontational test. facial sensation and strength were normal. hearing was intact to finger rubbing bilaterally. Uvula tongue midline.  head turning and shoulder shrug and were normal and symmetric.Tongue protrusion into cheek strength was normal.  Motor: limited range of motion of left shoulder, no significant weakness.  Sensory: mildly length dependent fine touch, pinprick to above ankle level, preserved toe vibratory sensation, and proprioception   Coordination: Normal finger to nose, heel-to-shin bilaterally there was no truncal ataxia  Gait: Rising up from seated position without assistance, normal stance, without trunk ataxia, moderate stride,   Romberg signs: Negative  Deep tendon reflexes: Brachioradialis 2/2, biceps 2/2, triceps 2/2, patellar 2/2, Achilles 1/1, plantar responses were flexor bilaterally.   Assessment and Plan: 63 years old Caucasian male, with past medical history of hyperlipidemia, skin cancer, nocturnal generalized seizure, is on long-standing phenobarbital treatment, developed mild length  dependent sensory changes, he wants to try different antiepileptic medications.   keppra xr 750mg  qhs.  RTC as needed

## 2014-02-21 ENCOUNTER — Other Ambulatory Visit: Payer: Self-pay | Admitting: Rheumatology

## 2014-02-21 ENCOUNTER — Ambulatory Visit
Admission: RE | Admit: 2014-02-21 | Discharge: 2014-02-21 | Disposition: A | Discharge status: Home / Self Care | Payer: BC Managed Care – PPO | Source: Ambulatory Visit | Attending: Rheumatology | Admitting: Rheumatology

## 2014-02-21 DIAGNOSIS — R1031 Right lower quadrant pain: Secondary | ICD-10-CM

## 2014-07-16 ENCOUNTER — Other Ambulatory Visit: Payer: Self-pay | Admitting: Orthopedic Surgery

## 2014-07-16 ENCOUNTER — Ambulatory Visit
Admission: RE | Admit: 2014-07-16 | Discharge: 2014-07-16 | Disposition: A | Discharge status: Home / Self Care | Payer: BC Managed Care – PPO | Source: Ambulatory Visit | Attending: Orthopedic Surgery | Admitting: Orthopedic Surgery

## 2014-07-16 ENCOUNTER — Other Ambulatory Visit: Payer: BC Managed Care – PPO

## 2014-07-16 DIAGNOSIS — M25551 Pain in right hip: Secondary | ICD-10-CM

## 2014-09-03 DIAGNOSIS — M21862 Other specified acquired deformities of left lower leg: Secondary | ICD-10-CM | POA: Insufficient documentation

## 2014-09-03 DIAGNOSIS — M2142 Flat foot [pes planus] (acquired), left foot: Secondary | ICD-10-CM | POA: Insufficient documentation

## 2014-09-03 DIAGNOSIS — M76829 Posterior tibial tendinitis, unspecified leg: Secondary | ICD-10-CM | POA: Insufficient documentation

## 2014-10-16 ENCOUNTER — Other Ambulatory Visit: Payer: Self-pay | Admitting: Neurology

## 2015-03-20 ENCOUNTER — Other Ambulatory Visit: Payer: Self-pay | Admitting: Orthopedic Surgery

## 2015-03-20 DIAGNOSIS — R52 Pain, unspecified: Secondary | ICD-10-CM

## 2015-04-07 ENCOUNTER — Other Ambulatory Visit: Payer: BC Managed Care – PPO

## 2015-04-07 ENCOUNTER — Ambulatory Visit
Admission: RE | Admit: 2015-04-07 | Discharge: 2015-04-07 | Disposition: A | Discharge status: Home / Self Care | Payer: BC Managed Care – PPO | Source: Ambulatory Visit | Attending: Orthopedic Surgery | Admitting: Orthopedic Surgery

## 2015-04-07 DIAGNOSIS — R52 Pain, unspecified: Secondary | ICD-10-CM

## 2015-11-18 ENCOUNTER — Other Ambulatory Visit: Payer: BC Managed Care – PPO

## 2015-11-18 ENCOUNTER — Ambulatory Visit
Admission: RE | Admit: 2015-11-18 | Discharge: 2015-11-18 | Disposition: A | Discharge status: Home / Self Care | Payer: BC Managed Care – PPO | Source: Ambulatory Visit | Attending: Orthopedic Surgery | Admitting: Orthopedic Surgery

## 2015-11-18 DIAGNOSIS — R52 Pain, unspecified: Secondary | ICD-10-CM

## 2017-09-19 HISTORY — PX: TRANSRECTAL ULTRASOUND: SHX5146

## 2017-09-19 HISTORY — PX: BIOPSY PROSTATE: PRO28

## 2017-09-30 ENCOUNTER — Encounter: Payer: Self-pay | Admitting: Radiation Oncology

## 2017-09-30 DIAGNOSIS — H00019 Hordeolum externum unspecified eye, unspecified eyelid: Secondary | ICD-10-CM | POA: Diagnosis not present

## 2017-09-30 DIAGNOSIS — H579 Unspecified disorder of eye and adnexa: Secondary | ICD-10-CM | POA: Diagnosis not present

## 2017-10-13 ENCOUNTER — Encounter: Payer: Self-pay | Admitting: Radiation Oncology

## 2017-10-14 NOTE — Progress Notes (Signed)
Radiation Oncology         [phone]) [phone] ________________________________  Initial outpatient Consultation  Name: [name] MRN: [id number]  Date: 10/17/2017  DOB: 01-27-1951  DD:UKGUR, Herbie Baltimore, MD  Ardis Hughs, MD   REFERRING PHYSICIAN: Ardis Hughs, MD  DIAGNOSIS: 67 y.o. gentleman with unfavorable, intermediate risk, Stage T1c adenocarcinoma of the prostate with Gleason Score of 4+3, and PSA of 4.65    ICD-10-CM   1. Malignant neoplasm of prostate ([place]) C61   2. Family history of cancer Z80.9     HISTORY OF PRESENT ILLNESS: [name] is a 67 y.o. male with a diagnosis of prostate cancer. He was noted to have an elevated PSA of 4.65 by his primary care physician, Dr. Maury Dus, MD. Previous PSA were 4.84 on 04/11/2017, 4.1 in 01/2017, 3.5 in 12/2015 and 2.6 in 11/2014. Accordingly, he was referred for evaluation in urology by Dr. Louis Meckel, Viona Gilmore, MD on 09/19/2017,  digital rectal examination was performed at that time revealing no nodules.  The patient proceeded to transrectal ultrasound with 12 biopsies of the prostate on 09/19/2017.  The prostate volume measured 31 grams.  Out of 12 core biopsies, 3 were positive.  The maximum Gleason score was 4+3=7, and this was seen in Left Base Lateral.  The patient reviewed the biopsy results with his urologist and he has kindly been referred today for discussion of potential radiation treatment options.   PREVIOUS RADIATION THERAPY: No  PAST MEDICAL HISTORY:  Past Medical History:  Diagnosis Date  . Arthritis   . GERD (gastroesophageal reflux disease)   . Hyperlipemia   . Hypertriglyceridemia   . Prostate cancer ([place])   . Seizures ([place])    started age 68-last one 61  . Wears glasses       PAST SURGICAL HISTORY: Past Surgical History:  Procedure Laterality Date  . BIOPSY PROSTATE  09/19/2017  . BRONCHOSCOPY  8/10   removal vocal cord polyp  . CAPSULAR RELEASE Left 10/02/2012   Procedure: LEFT  ARTHROSCOPY CAPSULAR RELEASE;  Surgeon: Nita Sells, MD;  Location: [facility];  Service: Orthopedics;  Laterality: Left;  . COLONOSCOPY    . KNEE ARTHROSCOPY  5/13   lt  . POLYPECTOMY  1988   vocal cords  . TONSILLECTOMY    . TRANSRECTAL ULTRASOUND  09/19/2017    FAMILY HISTORY:  Family History  Problem Relation Age of Onset  . Arthritis Mother   . Dementia Mother   . Cancer - Prostate Father   . Cancer - Prostate Brother     SOCIAL HISTORY:  Social History   Socioeconomic History  . Marital status: Married    Spouse name: Barnett Applebaum  . Number of children: 2  . Years of education: college  . Highest education level: Not on file  Occupational History  . Occupation: Pharmacist, hospital     Comment: retired  Scientific laboratory technician  . Financial resource strain: Not on file  . Food insecurity:    Worry: Not on file    Inability: Not on file  . Transportation needs:    Medical: Not on file    Non-medical: Not on file  Tobacco Use  . Smoking status: Former Smoker    Packs/day: 0.50    Years: 20.00    Pack years: 10.00    Types: Cigarettes    Last attempt to quit: 04/19/2009    Years since quitting: 8.5  . Smokeless tobacco: Never Used  . Tobacco comment:  quit 67 years old  Substance and Sexual Activity  . Alcohol use: No  . Drug use: No  . Sexual activity: Not Currently  Lifestyle  . Physical activity:    Days per week: Not on file    Minutes per session: Not on file  . Stress: Not on file  Relationships  . Social connections:    Talks on phone: Not on file    Gets together: Not on file    Attends religious service: Not on file    Active member of club or organization: Not on file    Attends meetings of clubs or organizations: Not on file    Relationship status: Not on file  . Intimate partner violence:    Fear of current or ex partner: Not on file    Emotionally abused: Not on file    Physically abused: Not on file    Forced sexual activity: Not on file   Other Topics Concern  . Not on file  Social History Narrative   Patient lives at home with his wife Rozetta Nunnery). Patient has two children. Patient is retired Education officer, museum. Patient drinks caffeine daily.    Both handed.        The patient lives in [place].  ALLERGIES: Patient has no known allergies.  MEDICATIONS:  Current Outpatient Medications  Medication Sig Dispense Refill  . aspirin 81 MG tablet Take 81 mg by mouth daily.    . calcium carbonate (OS-CAL) 600 MG TABS Take by mouth 2 (two) times daily with a meal. 1,280 mg. qd    . Dexlansoprazole (DEXILANT PO) Take by mouth.    . fenofibrate (TRICOR) 145 MG tablet Take 160 mg by mouth daily.     . folic acid (FOLVITE) 517 MCG tablet Take 400 mcg by mouth daily.    Marland Kitchen levETIRAcetam (KEPPRA) 750 MG tablet Take 750 mg by mouth 2 (two) times daily.    Marland Kitchen lubiprostone (AMITIZA) 24 MCG capsule Take 24 mcg by mouth 2 (two) times daily with a meal.    . metFORMIN (GLUCOPHAGE) 500 MG tablet Take by mouth 2 (two) times daily with a meal.    . simvastatin (ZOCOR) 20 MG tablet Take 40 mg by mouth daily.     Marland Kitchen Dexlansoprazole 30 MG capsule Take 30 mg by mouth daily.    . hydrocortisone (ANUSOL-HC) 25 MG suppository Place 25 mg rectally 2 (two) times daily as needed.     . Levetiracetam 750 MG TB24 take 1 tablet by mouth every evening 30 tablet 0   No current facility-administered medications for this encounter.     REVIEW OF SYSTEMS:  On review of systems, the patient reports that he is doing well overall. He denies any chest pain, shortness of breath, cough, fevers, chills, night sweats, unintended weight changes. He denies any bowel disturbances, and denies abdominal pain, nausea or vomiting. He denies any new musculoskeletal or joint aches or pains. His IPSS was 19, indicating moderate urinary symptoms. He is able to complete sexual activity with rare attempts. A complete review of systems is obtained and is otherwise negative.      PHYSICAL EXAM:  Wt Readings from Last 3 Encounters:  10/17/17 196 lb 9.6 oz (89.2 kg)  10/17/17 196 lb 9.6 oz (89.2 kg)  10/09/13 213 lb (96.6 kg)   Temp Readings from Last 3 Encounters:  10/17/17 98.8 F (37.1 C) (Oral)  10/17/17 98.8 F (37.1 C) (Oral)  10/02/12 97.6 F (36.4 C)  BP Readings from Last 3 Encounters:  10/17/17 110/78  10/17/17 110/78  10/09/13 120/81   Pulse Readings from Last 3 Encounters:  10/17/17 76  10/17/17 76  10/09/13 79   Pain Assessment Pain Score: 0-No pain/10  In general this is a well appearing Caucasian male in no acute distress. He is alert and oriented x4 and appropriate throughout the examination. HEENT reveals that the patient is normocephalic, atraumatic. EOMs are intact. PERRLA. Skin is intact without any evidence of gross lesions. Cardiovascular exam reveals a regular rate and rhythm, no clicks rubs or murmurs are auscultated. Chest is clear to auscultation bilaterally. Lymphatic assessment is performed and does not reveal any adenopathy in the cervical, supraclavicular, axillary, or inguinal chains. Abdomen has active bowel sounds in all quadrants and is intact. The abdomen is soft, non tender, non distended. Lower extremities are negative for pretibial pitting edema, deep calf tenderness, cyanosis or clubbing.   KPS = 100  100 - Normal; no complaints; no evidence of disease. 90   - Able to carry on normal activity; minor signs or symptoms of disease. 80   - Normal activity with effort; some signs or symptoms of disease. 40   - Cares for self; unable to carry on normal activity or to do active work. 60   - Requires occasional assistance, but is able to care for most of his personal needs. 50   - Requires considerable assistance and frequent medical care. 39   - Disabled; requires special care and assistance. 17   - Severely disabled; hospital admission is indicated although death not imminent. 66   - Very sick; hospital admission  necessary; active supportive treatment necessary. 10   - Moribund; fatal processes progressing rapidly. 0     - Dead  Karnofsky DA, Abelmann [place], Craver LS and Burchenal [facility] [phone]) The use of the nitrogen mustards in the palliative treatment of carcinoma: with particular reference to bronchogenic carcinoma Cancer 1 634-56  LABORATORY DATA:  Lab Results  Component Value Date   WBC 6.2 11/18/2008   HGB 15.5 10/02/2012   HCT 44.6 11/18/2008   MCV 96.7 11/18/2008   PLT 198 11/18/2008   Lab Results  Component Value Date   NA 140 11/18/2008   K 5.0 11/18/2008   CL 103 11/18/2008   CO2 31 11/18/2008   Lab Results  Component Value Date   ALT 33 11/18/2008   AST 28 11/18/2008   ALKPHOS 81 11/18/2008   BILITOT 0.6 11/18/2008     RADIOGRAPHY: No results found.    IMPRESSION/PLAN: 1. 67 y.o. gentleman with unfavorable, intermediate risk, Stage T1c adenocarcinoma of the prostate with Gleason Score of 4+3, and PSA of 4.65. We discussed the patient's workup and outlines the nature of prostate cancer in this setting. The patient's T stage, Gleason's score, and PSA put him into the unfavorable, intermediate risk group. Accordingly, he is eligible for a variety of potential treatment options including brachytherapy, surgical resection, or 5 1/2 weeks of external radiation. We discussed the available radiation techniques, and focused on the details and logistics and delivery. We discussed and outlined the risks, benefits, short and long-term effects associated with radiotherapy and compared and contrasted these with prostatectomy. We discussed the role of SpaceOAR in reducing the rectal toxicity associated with radiotherapy. He is interested in moving forward with external beam radiotherapy. We will notify Dr. Louis Meckel about this plan, and contact him to begin the simulation process. 2. Possible genetic predisposition to malignancy. The patient is  a candidate for genetic testing given his personal and  family history. He was offered referral and is interested in proceeding. A referral will be placed.      Carola Rhine, PAC    Tyler Pita, MD  [facility] Direct Dial: [phone]  Fax: [phone] [email or web address]  Skype  [email or web address]    Page Me        This document serves as a record of services personally performed by Tyler Pita, MD and Carola Rhine, PA-C. It was created on their behalf by Margit Banda, a trained medical scribe. The creation of this record is based on the scribe's personal observations and the provider's statements to them. This document has been checked and approved by the attending provider.

## 2017-10-17 ENCOUNTER — Ambulatory Visit
Admission: RE | Admit: 2017-10-17 | Discharge: 2017-10-17 | Disposition: A | Discharge status: Home / Self Care | Payer: BC Managed Care – PPO | Source: Ambulatory Visit | Attending: Radiation Oncology | Admitting: Radiation Oncology

## 2017-10-17 ENCOUNTER — Other Ambulatory Visit: Payer: Self-pay

## 2017-10-17 ENCOUNTER — Encounter

## 2017-10-17 ENCOUNTER — Encounter: Payer: Self-pay | Admitting: Medical Oncology

## 2017-10-17 ENCOUNTER — Encounter: Payer: Self-pay | Admitting: Radiation Oncology

## 2017-10-17 VITALS — BP 110/78 | HR 76 | Temp 98.8°F | Resp 20 | Wt 196.6 lb

## 2017-10-17 DIAGNOSIS — C61 Malignant neoplasm of prostate: Secondary | ICD-10-CM | POA: Diagnosis not present

## 2017-10-17 DIAGNOSIS — Z809 Family history of malignant neoplasm, unspecified: Secondary | ICD-10-CM

## 2017-10-17 HISTORY — DX: Malignant neoplasm of prostate: C61

## 2017-10-17 NOTE — Progress Notes (Signed)
See progress note under physician encounter. 

## 2017-10-17 NOTE — Progress Notes (Signed)
Introduced myself to [name] as the prostate nurse navigator and my role. He states he has a strong family history of prostate cancer that includes his father and brother. He is not interested in surgery but here to discuss his radiation options.

## 2017-10-17 NOTE — Progress Notes (Signed)
GU Location of Tumor / Histology: Adenocarcinoma of the Prostate  If Prostate Cancer, on 09/19/2017 his Gleason Score was (4 + 3) and Prostate Volume was ~31 grams, and on 08/01/2017 PSA was (4.65)  [name] presented about 2 months ago with signs/symptoms of: recent onset or worsening symptoms of a lower UTI and elevated PSA score.   Biopsies on 09/19/2017 revealed:    Past/Anticipated interventions by urology, if any:  09/19/2017 Transrectal ultrasound with prostate biopsies  Past/Anticipated interventions by medical oncology, if any: no  Weight changes, if any: no  Bowel/Bladder complaints, if any: SHIM 20. IPSS 19. Reports dysuria. Denies hematuria. Reports occasional urinary leakage the size of a quarter.   Nausea/Vomiting, if any: no  Pain issues, if any:  no  SAFETY ISSUES:  Prior radiation? no  Pacemaker/ICD? no  Possible current pregnancy? N/A  Is the patient on methotrexate? no  Current Complaints / other details:  67 year old male. Married x 38 years. . Retired middle school Music therapist.   Seizure disorder controlled with Keppra. Had grand mal seizure at the age of 36.   Patient's father and brother were diagnosed with prostate cancer.

## 2017-10-18 DIAGNOSIS — C61 Malignant neoplasm of prostate: Secondary | ICD-10-CM | POA: Insufficient documentation

## 2017-10-26 ENCOUNTER — Telehealth: Payer: Self-pay | Admitting: Radiation Oncology

## 2017-10-26 ENCOUNTER — Other Ambulatory Visit: Payer: Self-pay | Admitting: Radiation Oncology

## 2017-10-26 DIAGNOSIS — C61 Malignant neoplasm of prostate: Secondary | ICD-10-CM

## 2017-10-26 NOTE — Telephone Encounter (Signed)
Scheduled appt pe 7/10 sch message - pt is aware of appt date and time.

## 2017-10-31 ENCOUNTER — Telehealth: Payer: Self-pay | Admitting: Licensed Clinical Social Worker

## 2017-10-31 ENCOUNTER — Encounter: Payer: Self-pay | Admitting: Licensed Clinical Social Worker

## 2017-10-31 ENCOUNTER — Inpatient Hospital Stay: Payer: BC Managed Care – PPO

## 2017-10-31 ENCOUNTER — Inpatient Hospital Stay: Payer: BC Managed Care – PPO | Attending: Genetic Counselor | Admitting: Licensed Clinical Social Worker

## 2017-10-31 DIAGNOSIS — C61 Malignant neoplasm of prostate: Secondary | ICD-10-CM

## 2017-10-31 DIAGNOSIS — Z8042 Family history of malignant neoplasm of prostate: Secondary | ICD-10-CM

## 2017-10-31 NOTE — Progress Notes (Addendum)
REFERRING PROVIDER: Hayden Pedro, PA-C [place], [place] [address]  PRIMARY PROVIDER:  Maury Dus, MD  PRIMARY REASON FOR VISIT:  1. Malignant neoplasm of prostate ([facility])   2. Family history of prostate cancer      HISTORY OF PRESENT ILLNESS:   [name], a 67 y.o. male, was seen for a [facility] cancer genetics consultation at the request of Dr. Dara Lords due to a personal and family history of cancer.  Mr. Decelles presents to clinic today to discuss the possibility of a hereditary predisposition to cancer, genetic testing, and to further clarify his future cancer risks, as well as potential cancer risks for family members.   In 2019, at the age of 11, Mr. Beckles was diagnosed with Stage T1c prostate cancer, Gleason score 4+3. He reports the current treatment plan is radiation.   MEDICAL HISTORY:  Colonoscopy: yes; 1-2 polyps found on 2 of his colonoscopies.  Prostate screening: yes.  Smoking: no. Alcohol: no.   Past Medical History:  Diagnosis Date  . Arthritis   . Family history of prostate cancer   . GERD (gastroesophageal reflux disease)   . Hyperlipemia   . Hypertriglyceridemia   . Prostate cancer ([facility])   . Seizures ([facility])    started age 27-last one 7  . Wears glasses     Past Surgical History:  Procedure Laterality Date  . BIOPSY PROSTATE  09/19/2017  . BRONCHOSCOPY  8/10   removal vocal cord polyp  . CAPSULAR RELEASE Left 10/02/2012   Procedure: LEFT ARTHROSCOPY CAPSULAR RELEASE;  Surgeon: Nita Sells, MD;  Location: [facility];  Service: Orthopedics;  Laterality: Left;  . COLONOSCOPY    . KNEE ARTHROSCOPY  5/13   lt  . POLYPECTOMY  1988   vocal cords  . TONSILLECTOMY    . TRANSRECTAL ULTRASOUND  09/19/2017    Social History   Socioeconomic History  . Marital status: Married    Spouse name: Barnett Applebaum  . Number of children: 2  . Years of education: college  . Highest education level: Not on file   Occupational History  . Occupation: Pharmacist, hospital     Comment: retired  Scientific laboratory technician  . Financial resource strain: Not on file  . Food insecurity:    Worry: Not on file    Inability: Not on file  . Transportation needs:    Medical: Not on file    Non-medical: Not on file  Tobacco Use  . Smoking status: Former Smoker    Packs/day: 0.50    Years: 20.00    Pack years: 10.00    Types: Cigarettes    Last attempt to quit: 04/19/2009    Years since quitting: 8.5  . Smokeless tobacco: Never Used  . Tobacco comment: quit 67 years old  Substance and Sexual Activity  . Alcohol use: No  . Drug use: No  . Sexual activity: Not Currently  Lifestyle  . Physical activity:    Days per week: Not on file    Minutes per session: Not on file  . Stress: Not on file  Relationships  . Social connections:    Talks on phone: Not on file    Gets together: Not on file    Attends religious service: Not on file    Active member of club or organization: Not on file    Attends meetings of clubs or organizations: Not on file    Relationship status: Not on file  Other Topics Concern  .  Not on file  Social History Narrative   Patient lives at home with his wife Rozetta Nunnery). Patient has two children. Patient is retired Education officer, museum. Patient drinks caffeine daily.    Both handed.           FAMILY HISTORY:  We obtained a detailed, 4-generation family history.  Significant diagnoses are listed below: Family History  Problem Relation Age of Onset  . Arthritis Mother   . Dementia Mother   . Cancer - Prostate Father 44  . Cancer - Prostate Brother 33  . Alzheimer's disease Maternal Uncle   . Autism Daughter    Mr. Joplin has two daughters, aged 58 and 39. The oldest daughter has autism. Neither of them have cancer history.  Mr. Defenbaugh has a brother who was diagnosed with prostate cancer at 73, he reports that this brother had 7 tumors. This brother has 4 children with no cancer history. Mr. Levee also  has a sister who is 75 with no cancer history. His sister has a son and a daughter with no cancer history, however he reports that his nephew was recommended to have his prostate removed but he does not know why.  Mr. Demello mother is living at 51 years of age with no cancer history. She has two brothers and a sister, both of her brothers have passed away, no cancer history. Her sister is living at 85 with no cancer history. None of Mr. Kalas maternal cousins have any history of cancer. Mr. Scholer maternal grandmother passed away at 73, no cancer history. His maternal grandfather passed away of heart disease at 23, no cancer history.  Mr. Moss father was diagnosed with prostate cancer at 4 and passed away at 60. Mr. Sermersheim reports that his father had seeds placed and this caused his father to develop colon cancer. His father had two sisters and a brother, one sister is currently living and the other sister and brother have passed away, no cancer history. None of Mr. Salm paternal cousins have a history of cancer. His paternal grandmother died at 45 of heart disease, no cancer history. His paternal grandfather died at 63, no cancer history.   Mr. Douville is unaware of previous family history of genetic testing for hereditary cancer risks. Patient's maternal ancestors are of [place] descent, and paternal ancestors are of [place] descent. There is no reported Ashkenazi Jewish ancestry. There is no known consanguinity.  GENETIC COUNSELING ASSESSMENT: HENDRICK PAVICH is a 67 y.o. male with a personal and family history which is somewhat suggestive of a hereditary cancer syndrome. We, therefore, discussed and recommended the following at today's visit.   DISCUSSION: We reviewed the characteristics, features and inheritance patterns of hereditary cancer syndromes. We also discussed genetic testing, including the appropriate family members to test, the process of testing, insurance coverage  and turn-around-time for results. We discussed the implications of a negative, positive and/or variant of uncertain significant result. We recommended Mr. Arismendez pursue genetic testing for the [facility] Common Hereditary Cancer 47 gene panel.   The Common Hereditary Cancer Panel offered by [facility] includes sequencing and/or deletion duplication testing of the following 47 genes: APC, ATM, AXIN2, BARD1, BMPR1A, BRCA1, BRCA2, BRIP1, CDH1, CDKN2A (p14ARF), CDKN2A (p16INK4a), CKD4, CHEK2, CTNNA1, DICER1, EPCAM (Deletion/duplication testing only), GREM1 (promoter region deletion/duplication testing only), KIT, MEN1, MLH1, MSH2, MSH3, MSH6, MUTYH, NBN, NF1, NHTL1, PALB2, PDGFRA, PMS2, POLD1, POLE, PTEN, RAD50, RAD51C, RAD51D, SDHB, SDHC, SDHD, SMAD4, SMARCA4. STK11, TP53, TSC1, TSC2, and VHL.  The following  genes were evaluated for sequence changes only: SDHA and HOXB13 c.251G>A variant only.  Based on Mr. Austad personal and family history of cancer, he meets medical criteria for genetic testing. Despite that he meets criteria, he may still have an out of pocket cost.   PLAN:  Despite our recommendation, Mr. Mehrer did not wish to pursue genetic testing at today's visit. He wishes to wait to pursue testing until he receives his new insurance information in August. We understand this decision, and remain available to coordinate genetic testing at any time in the future. We, therefore, recommend Mr. Groleau continue to follow the cancer screening guidelines given by his primary healthcare provider.  11/21/2017: Mr. Speir returned today to have genetic testing for the [facility] Common Hereditary Cancers Panel. Results will be available in 2-3 weeks time.   Lastly, we encouraged Mr. Diveley to remain in contact with cancer genetics annually so that we can continuously update the family history and inform him of any changes in cancer genetics and testing that may be of benefit for this family.   Mr.  Mostafa  questions were answered to his satisfaction today. Our contact information was provided should additional questions or concerns arise. Thank you for the referral and allowing [place] to share in the care of your patient.   Epimenio Foot MS Genetic Counselor [email or web address] phone: [phone]  The patient was seen for a total of 60 minutes in face-to-face genetic counseling.  This patient was discussed with Drs. Magrinat, Lindi Adie and/or Burr Medico who agrees with the above.

## 2017-10-31 NOTE — Telephone Encounter (Signed)
Mr. Sumler called to reschedule his blood draw for genetic testing to Aug. 5.

## 2017-11-04 ENCOUNTER — Telehealth: Payer: Self-pay | Admitting: *Deleted

## 2017-11-04 NOTE — Telephone Encounter (Signed)
Called patient to give an update, lvm for a return call

## 2017-11-08 ENCOUNTER — Telehealth: Payer: Self-pay | Admitting: *Deleted

## 2017-11-08 NOTE — Telephone Encounter (Signed)
Called patient to inform of fid. markers and space oar on August 13 @ Dr. Carlton Adam and his sim on August 15 @ Dr. Johny Shears Office, spoke with patient and he is aware of these appts.

## 2017-11-17 ENCOUNTER — Other Ambulatory Visit: Payer: Self-pay | Admitting: Urology

## 2017-11-17 DIAGNOSIS — C61 Malignant neoplasm of prostate: Secondary | ICD-10-CM

## 2017-11-21 ENCOUNTER — Inpatient Hospital Stay: Payer: No Typology Code available for payment source | Attending: Genetic Counselor

## 2017-11-21 DIAGNOSIS — Z8042 Family history of malignant neoplasm of prostate: Secondary | ICD-10-CM | POA: Diagnosis not present

## 2017-11-21 DIAGNOSIS — C61 Malignant neoplasm of prostate: Secondary | ICD-10-CM | POA: Diagnosis not present

## 2017-11-29 ENCOUNTER — Telehealth: Payer: Self-pay | Admitting: *Deleted

## 2017-11-29 DIAGNOSIS — C61 Malignant neoplasm of prostate: Secondary | ICD-10-CM | POA: Diagnosis not present

## 2017-11-29 NOTE — Telephone Encounter (Signed)
CALLED PATIENT TO REMIND OF SIM APPT. FOR 12-01-17- ARRIVAL TIME - 1:45 PM @ [facility] ON 12-01-17 - ARRIVAL TIME- 3:30 PM @ [facility], NO RESTRICTIONS TO TEST, SPOKE WITH PATIENT AND HE IS AWARE OF THESE APPTS.

## 2017-11-30 DIAGNOSIS — M25531 Pain in right wrist: Secondary | ICD-10-CM | POA: Diagnosis not present

## 2017-11-30 DIAGNOSIS — M19041 Primary osteoarthritis, right hand: Secondary | ICD-10-CM | POA: Diagnosis not present

## 2017-12-01 ENCOUNTER — Ambulatory Visit: Admission: RE | Admit: 2017-12-01 | Payer: Medicare Other | Source: Ambulatory Visit | Admitting: Radiation Oncology

## 2017-12-02 ENCOUNTER — Encounter: Payer: Self-pay | Admitting: Licensed Clinical Social Worker

## 2017-12-02 ENCOUNTER — Ambulatory Visit
Admission: RE | Admit: 2017-12-02 | Discharge: 2017-12-02 | Disposition: A | Discharge status: Home / Self Care | Payer: Medicare Other | Source: Ambulatory Visit | Attending: Radiation Oncology | Admitting: Radiation Oncology

## 2017-12-02 ENCOUNTER — Ambulatory Visit: Payer: Self-pay | Admitting: Licensed Clinical Social Worker

## 2017-12-02 ENCOUNTER — Ambulatory Visit (HOSPITAL_COMMUNITY)
Admission: RE | Admit: 2017-12-02 | Discharge: 2017-12-02 | Disposition: A | Discharge status: Home / Self Care | Payer: Medicare Other | Source: Ambulatory Visit | Attending: Urology | Admitting: Urology

## 2017-12-02 ENCOUNTER — Encounter: Payer: Self-pay | Admitting: Medical Oncology

## 2017-12-02 ENCOUNTER — Telehealth: Payer: Self-pay | Admitting: Licensed Clinical Social Worker

## 2017-12-02 DIAGNOSIS — C61 Malignant neoplasm of prostate: Secondary | ICD-10-CM | POA: Diagnosis not present

## 2017-12-02 DIAGNOSIS — Z510 Encounter for antineoplastic radiation therapy: Secondary | ICD-10-CM | POA: Insufficient documentation

## 2017-12-02 DIAGNOSIS — Z0000 Encounter for general adult medical examination without abnormal findings: Secondary | ICD-10-CM | POA: Insufficient documentation

## 2017-12-02 DIAGNOSIS — Z1379 Encounter for other screening for genetic and chromosomal anomalies: Secondary | ICD-10-CM | POA: Insufficient documentation

## 2017-12-02 NOTE — Progress Notes (Signed)
HPI:  Mr. Ribas was previously seen in the [facility] clinic on 10/31/2017 due to a personal and family history of prostate cancer and concerns regarding a hereditary predisposition to cancer. Please refer to our prior cancer genetics clinic note for more information regarding Mr. Sias's medical, social and family histories, and our assessment and recommendations, at the time. Mr. Salce recent genetic test results were disclosed to him, as well as recommendations warranted by these results. These results and recommendations are discussed in more detail below.  CANCER HISTORY:    Malignant neoplasm of prostate ([facility])   10/18/2017 Initial Diagnosis    Malignant neoplasm of prostate ([facility])     Genetic Testing    Negative genetic testing on the [facility] Common Hereditary Cancers Panel. The Common Hereditary Cancers Panel offered by [facility] includes sequencing and/or deletion duplication testing of the following 47 genes: APC, ATM, AXIN2, BARD1, BMPR1A, BRCA1, BRCA2, BRIP1, CDH1, CDKN2A (p14ARF), CDKN2A (p16INK4a), CKD4, CHEK2, CTNNA1, DICER1, EPCAM (Deletion/duplication testing only), GREM1 (promoter region deletion/duplication testing only), KIT, MEN1, MLH1, MSH2, MSH3, MSH6, MUTYH, NBN, NF1, NHTL1, PALB2, PDGFRA, PMS2, POLD1, POLE, PTEN, RAD50, RAD51C, RAD51D, SDHB, SDHC, SDHD, SMAD4, SMARCA4. STK11, TP53, TSC1, TSC2, and VHL.  The following genes were evaluated for sequence changes only: SDHA and HOXB13 c.251G>A variant only.  The report date is 12/02/2017.       FAMILY HISTORY:  We obtained a detailed, 4-generation family history.  Significant diagnoses are listed below: Family History  Problem Relation Age of Onset  . Arthritis Mother   . Dementia Mother   . Cancer - Prostate Father 16  . Cancer - Prostate Brother 29  . Alzheimer's disease Maternal Uncle   . Autism Daughter    Mr. Lage has two daughters, aged 28 and 23. The oldest daughter has autism. Neither of them  have cancer history.  Mr. Bloyd has a brother who was diagnosed with prostate cancer at 49, he reports that this brother had 7 tumors. This brother has 4 children with no cancer history. Mr. Maciolek also has a sister who is 18 with no cancer history. His sister has a son and a daughter with no cancer history, however he reports that his nephew was recommended to have his prostate removed but he does not know why.  Mr. Lenis mother is living at 83 years of age with no cancer history. She has two brothers and a sister, both of her brothers have passed away, no cancer history. Her sister is living at 51 with no cancer history. None of Mr. Eskew maternal cousins have any history of cancer. Mr. Casanas maternal grandmother passed away at 29, no cancer history. His maternal grandfather passed away of heart disease at 68, no cancer history.  Mr. Spivak father was diagnosed with prostate cancer at 38 and passed away at 22. Mr. Angelillo reports that his father had seeds placed and this caused his father to develop colon cancer. His father had two sisters and a brother, one sister is currently living and the other sister and brother have passed away, no cancer history. None of Mr. Salois paternal cousins have a history of cancer. His paternal grandmother died at 93 of heart disease, no cancer history. His paternal grandfather died at 12, no cancer history.   Mr. Bozzi is unaware of previous family history of genetic testing for hereditary cancer risks. Patient's maternal ancestors are of [place] descent, and paternal ancestors are of [place] descent. There is no reported Ashkenazi [place]  ancestry. There is no known consanguinity.   GENETIC TEST RESULTS: Genetic testing performed through [facility]'s Common Hereditary Cancers Panel reported out on 12/01/2017 showed no pathogenic mutations. The Common Hereditary Cancers Panel offered by [facility] includes sequencing and/or deletion duplication  testing of the following 47 genes: APC, ATM, AXIN2, BARD1, BMPR1A, BRCA1, BRCA2, BRIP1, CDH1, CDKN2A (p14ARF), CDKN2A (p16INK4a), CKD4, CHEK2, CTNNA1, DICER1, EPCAM (Deletion/duplication testing only), GREM1 (promoter region deletion/duplication testing only), KIT, MEN1, MLH1, MSH2, MSH3, MSH6, MUTYH, NBN, NF1, NHTL1, PALB2, PDGFRA, PMS2, POLD1, POLE, PTEN, RAD50, RAD51C, RAD51D, SDHB, SDHC, SDHD, SMAD4, SMARCA4. STK11, TP53, TSC1, TSC2, and VHL.  The following genes were evaluated for sequence changes only: SDHA and HOXB13 c.251G>A variant only.  The test report will be scanned into EPIC and will be located under the Molecular Pathology section of the Results Review tab. A portion of the result report is included below for reference.     We discussed with Mr. Obryant that because current genetic testing is not perfect, it is possible there may be a gene mutation in one of these genes that current testing cannot detect, but that chance is small.  We also discussed, that there could be another gene that has not yet been discovered, or that we have not yet tested, that is responsible for the cancer diagnoses in the family. It is also possible there is a hereditary cause for the cancer in the family that Mr. Fariss did not inherit and therefore was not identified in his testing.  Therefore, it is important to remain in touch with cancer genetics in the future so that we can continue to offer Mr. Norkus the most up to date genetic testing.   ADDITIONAL GENETIC TESTING: We discussed with Mr. Brosh that there are other genes that are associated with increased cancer risk that can be analyzed. The laboratories that offer this testing look at these additional genes via a hereditary cancer gene panel. Should Mr. Kishi wish to pursue additional genetic testing, we are happy to discuss and coordinate this testing, at any time.    CANCER SCREENING RECOMMENDATIONS: This result indicates that it is unlikely Mr.  Berwick has an increased risk for a future cancer due to a mutation in one of these genes. This normal test also suggests that Mr. Harnish cancer was most likely not due to an inherited predisposition associated with one of these genes.  Most cancers happen by chance and this negative test suggests that his cancer may fall into this category.  Therefore, it is recommended he continue to follow the cancer management and screening guidelines provided by his oncology and primary healthcare provider. Other factors such as her personal and family history may still affect his cancer risk.  RECOMMENDATIONS FOR FAMILY MEMBERS:  Relatives in this family might be at some increased risk of developing cancer, over the general population risk, simply due to the family history of cancer.  We recommended women in this family have a yearly mammogram beginning at age 59, or 73 years younger than the earliest onset of cancer, an annual clinical breast exam, and perform monthly breast self-exams. Women in this family should also have a gynecological exam as recommended by their primary provider. All family members should have a colonoscopy by age 71 (or as directed by their doctors).  All family members should inform their physicians about the family history of cancer so their doctors can make the most appropriate screening recommendations for them.   It is also possible there is  a hereditary cause for the cancer in Mr. Corlew family that he did not inherit and therefore was not identified in him. We recommended his brother have genetic counseling and testing. Mr. Gill will let us know if we can be of any assistance in coordinating genetic counseling and/or testing for these family members.   FOLLOW-UP: Lastly, we discussed with Mr. Hellenbrand that cancer genetics is a rapidly advancing field and it is possible that new genetic tests will be appropriate for him and/or his family members in the future. We encouraged him to  remain in contact with cancer genetics on an annual basis so we can update his personal and family histories and let him know of advances in cancer genetics that may benefit this family.   Our contact number was provided. Mr. Mccaster questions were answered to his satisfaction, and he knows he is welcome to call us at anytime with additional questions or concerns.   Epimenio Foot, MS Genetic Counselor [email or web address] Phone: [phone]

## 2017-12-02 NOTE — Telephone Encounter (Signed)
Revealed negative genetic testing.   This normal result is reassuring and indicates that it is unlikely [name] cancer is due to a hereditary cause.  It is unlikely that there is an increased risk of another cancer due to a mutation in one of these genes.  However, genetic testing is not perfect, and cannot definitively rule out a hereditary cause.  It will be important for him to keep in contact with genetics to learn if any additional testing may be needed in the future.

## 2017-12-02 NOTE — Progress Notes (Signed)
  Radiation Oncology         [phone]) [phone] ________________________________  Name: [name] MRN: [id number]  Date: 12/02/2017  DOB: 12-26-1950  SIMULATION AND TREATMENT PLANNING NOTE    ICD-10-CM   1. Malignant neoplasm of prostate ([facility]) C61     DIAGNOSIS:  67 y.o. gentleman with unfavorable, intermediate risk, Stage T1c adenocarcinoma of the prostate with Gleason Score of 4+3, and PSA of 4.65  NARRATIVE:  The patient was brought to the [facility].  Identity was confirmed.  All relevant records and images related to the planned course of therapy were reviewed.  The patient freely provided informed written consent to proceed with treatment after reviewing the details related to the planned course of therapy. The consent form was witnessed and verified by the simulation staff.  Then, the patient was set-up in a stable reproducible supine position for radiation therapy.  A vacuum lock pillow device was custom fabricated to position his legs in a reproducible immobilized position.  Then, I performed a urethrogram under sterile conditions to identify the prostatic apex.  CT images were obtained.  Surface markings were placed.  The CT images were loaded into the planning software.  Then the prostate target and avoidance structures including the rectum, bladder, bowel and hips were contoured.  Treatment planning then occurred.  The radiation prescription was entered and confirmed.  A total of one complex treatment devices was fabricated. I have requested : Intensity Modulated Radiotherapy (IMRT) is medically necessary for this case for the following reason:  Rectal sparing.Marland Kitchen  PLAN:  The patient will receive 70 Gy in 28 fractions.  ________________________________  Sheral Apley Tammi Klippel, M.D.

## 2017-12-07 DIAGNOSIS — Z510 Encounter for antineoplastic radiation therapy: Secondary | ICD-10-CM | POA: Diagnosis not present

## 2017-12-07 DIAGNOSIS — C61 Malignant neoplasm of prostate: Secondary | ICD-10-CM | POA: Diagnosis not present

## 2017-12-14 ENCOUNTER — Ambulatory Visit
Admission: RE | Admit: 2017-12-14 | Discharge: 2017-12-14 | Disposition: A | Discharge status: Home / Self Care | Payer: Medicare Other | Source: Ambulatory Visit | Attending: Radiation Oncology | Admitting: Radiation Oncology

## 2017-12-14 DIAGNOSIS — Z510 Encounter for antineoplastic radiation therapy: Secondary | ICD-10-CM | POA: Diagnosis not present

## 2017-12-14 DIAGNOSIS — C61 Malignant neoplasm of prostate: Secondary | ICD-10-CM | POA: Diagnosis not present

## 2017-12-15 ENCOUNTER — Ambulatory Visit
Admission: RE | Admit: 2017-12-15 | Discharge: 2017-12-15 | Disposition: A | Discharge status: Home / Self Care | Payer: Medicare Other | Source: Ambulatory Visit | Attending: Radiation Oncology | Admitting: Radiation Oncology

## 2017-12-15 DIAGNOSIS — H5203 Hypermetropia, bilateral: Secondary | ICD-10-CM | POA: Diagnosis not present

## 2017-12-15 DIAGNOSIS — C61 Malignant neoplasm of prostate: Secondary | ICD-10-CM | POA: Diagnosis not present

## 2017-12-15 DIAGNOSIS — E119 Type 2 diabetes mellitus without complications: Secondary | ICD-10-CM | POA: Diagnosis not present

## 2017-12-15 DIAGNOSIS — H524 Presbyopia: Secondary | ICD-10-CM | POA: Diagnosis not present

## 2017-12-15 DIAGNOSIS — Z510 Encounter for antineoplastic radiation therapy: Secondary | ICD-10-CM | POA: Diagnosis not present

## 2017-12-16 ENCOUNTER — Ambulatory Visit
Admission: RE | Admit: 2017-12-16 | Discharge: 2017-12-16 | Disposition: A | Discharge status: Home / Self Care | Payer: Medicare Other | Source: Ambulatory Visit | Attending: Radiation Oncology | Admitting: Radiation Oncology

## 2017-12-16 DIAGNOSIS — Z510 Encounter for antineoplastic radiation therapy: Secondary | ICD-10-CM | POA: Diagnosis not present

## 2017-12-16 DIAGNOSIS — C61 Malignant neoplasm of prostate: Secondary | ICD-10-CM | POA: Diagnosis not present

## 2017-12-20 ENCOUNTER — Ambulatory Visit
Admission: RE | Admit: 2017-12-20 | Discharge: 2017-12-20 | Disposition: A | Discharge status: Home / Self Care | Payer: Medicare Other | Source: Ambulatory Visit | Attending: Radiation Oncology | Admitting: Radiation Oncology

## 2017-12-20 DIAGNOSIS — C61 Malignant neoplasm of prostate: Secondary | ICD-10-CM | POA: Diagnosis not present

## 2017-12-20 DIAGNOSIS — Z510 Encounter for antineoplastic radiation therapy: Secondary | ICD-10-CM | POA: Diagnosis not present

## 2017-12-21 ENCOUNTER — Ambulatory Visit
Admission: RE | Admit: 2017-12-21 | Discharge: 2017-12-21 | Disposition: A | Discharge status: Home / Self Care | Payer: Medicare Other | Source: Ambulatory Visit | Attending: Radiation Oncology | Admitting: Radiation Oncology

## 2017-12-21 DIAGNOSIS — C61 Malignant neoplasm of prostate: Secondary | ICD-10-CM | POA: Diagnosis not present

## 2017-12-21 DIAGNOSIS — H903 Sensorineural hearing loss, bilateral: Secondary | ICD-10-CM | POA: Diagnosis not present

## 2017-12-21 DIAGNOSIS — Z510 Encounter for antineoplastic radiation therapy: Secondary | ICD-10-CM | POA: Diagnosis not present

## 2017-12-22 ENCOUNTER — Ambulatory Visit
Admission: RE | Admit: 2017-12-22 | Discharge: 2017-12-22 | Disposition: A | Discharge status: Home / Self Care | Payer: Medicare Other | Source: Ambulatory Visit | Attending: Radiation Oncology | Admitting: Radiation Oncology

## 2017-12-22 DIAGNOSIS — Z510 Encounter for antineoplastic radiation therapy: Secondary | ICD-10-CM | POA: Diagnosis not present

## 2017-12-22 DIAGNOSIS — C61 Malignant neoplasm of prostate: Secondary | ICD-10-CM | POA: Diagnosis not present

## 2017-12-23 ENCOUNTER — Ambulatory Visit
Admission: RE | Admit: 2017-12-23 | Discharge: 2017-12-23 | Disposition: A | Discharge status: Home / Self Care | Payer: Medicare Other | Source: Ambulatory Visit | Attending: Radiation Oncology | Admitting: Radiation Oncology

## 2017-12-23 DIAGNOSIS — Z510 Encounter for antineoplastic radiation therapy: Secondary | ICD-10-CM | POA: Diagnosis not present

## 2017-12-23 DIAGNOSIS — C61 Malignant neoplasm of prostate: Secondary | ICD-10-CM | POA: Diagnosis not present

## 2017-12-26 ENCOUNTER — Ambulatory Visit
Admission: RE | Admit: 2017-12-26 | Discharge: 2017-12-26 | Disposition: A | Discharge status: Home / Self Care | Payer: Medicare Other | Source: Ambulatory Visit | Attending: Radiation Oncology | Admitting: Radiation Oncology

## 2017-12-26 DIAGNOSIS — Z510 Encounter for antineoplastic radiation therapy: Secondary | ICD-10-CM | POA: Diagnosis not present

## 2017-12-26 DIAGNOSIS — C61 Malignant neoplasm of prostate: Secondary | ICD-10-CM | POA: Diagnosis not present

## 2017-12-27 ENCOUNTER — Ambulatory Visit
Admission: RE | Admit: 2017-12-27 | Discharge: 2017-12-27 | Disposition: A | Discharge status: Home / Self Care | Payer: Medicare Other | Source: Ambulatory Visit | Attending: Radiation Oncology | Admitting: Radiation Oncology

## 2017-12-27 DIAGNOSIS — C61 Malignant neoplasm of prostate: Secondary | ICD-10-CM | POA: Diagnosis not present

## 2017-12-27 DIAGNOSIS — Z510 Encounter for antineoplastic radiation therapy: Secondary | ICD-10-CM | POA: Diagnosis not present

## 2017-12-28 ENCOUNTER — Ambulatory Visit
Admission: RE | Admit: 2017-12-28 | Discharge: 2017-12-28 | Disposition: A | Discharge status: Home / Self Care | Payer: Medicare Other | Source: Ambulatory Visit | Attending: Radiation Oncology | Admitting: Radiation Oncology

## 2017-12-28 DIAGNOSIS — C61 Malignant neoplasm of prostate: Secondary | ICD-10-CM | POA: Diagnosis not present

## 2017-12-28 DIAGNOSIS — Z510 Encounter for antineoplastic radiation therapy: Secondary | ICD-10-CM | POA: Diagnosis not present

## 2017-12-29 ENCOUNTER — Ambulatory Visit
Admission: RE | Admit: 2017-12-29 | Discharge: 2017-12-29 | Disposition: A | Discharge status: Home / Self Care | Payer: Medicare Other | Source: Ambulatory Visit | Attending: Radiation Oncology | Admitting: Radiation Oncology

## 2017-12-29 DIAGNOSIS — Z510 Encounter for antineoplastic radiation therapy: Secondary | ICD-10-CM | POA: Diagnosis not present

## 2017-12-29 DIAGNOSIS — C61 Malignant neoplasm of prostate: Secondary | ICD-10-CM | POA: Diagnosis not present

## 2017-12-30 ENCOUNTER — Ambulatory Visit
Admission: RE | Admit: 2017-12-30 | Discharge: 2017-12-30 | Disposition: A | Discharge status: Home / Self Care | Payer: Medicare Other | Source: Ambulatory Visit | Attending: Radiation Oncology | Admitting: Radiation Oncology

## 2017-12-30 DIAGNOSIS — C61 Malignant neoplasm of prostate: Secondary | ICD-10-CM | POA: Diagnosis not present

## 2017-12-30 DIAGNOSIS — Z510 Encounter for antineoplastic radiation therapy: Secondary | ICD-10-CM | POA: Diagnosis not present

## 2018-01-02 ENCOUNTER — Ambulatory Visit
Admission: RE | Admit: 2018-01-02 | Discharge: 2018-01-02 | Disposition: A | Discharge status: Home / Self Care | Payer: Medicare Other | Source: Ambulatory Visit | Attending: Radiation Oncology | Admitting: Radiation Oncology

## 2018-01-02 DIAGNOSIS — Z510 Encounter for antineoplastic radiation therapy: Secondary | ICD-10-CM | POA: Diagnosis not present

## 2018-01-02 DIAGNOSIS — C61 Malignant neoplasm of prostate: Secondary | ICD-10-CM | POA: Diagnosis not present

## 2018-01-03 ENCOUNTER — Ambulatory Visit
Admission: RE | Admit: 2018-01-03 | Discharge: 2018-01-03 | Disposition: A | Discharge status: Home / Self Care | Payer: Medicare Other | Source: Ambulatory Visit | Attending: Radiation Oncology | Admitting: Radiation Oncology

## 2018-01-03 DIAGNOSIS — C61 Malignant neoplasm of prostate: Secondary | ICD-10-CM | POA: Diagnosis not present

## 2018-01-03 DIAGNOSIS — Z510 Encounter for antineoplastic radiation therapy: Secondary | ICD-10-CM | POA: Diagnosis not present

## 2018-01-04 ENCOUNTER — Ambulatory Visit
Admission: RE | Admit: 2018-01-04 | Discharge: 2018-01-04 | Disposition: A | Discharge status: Home / Self Care | Payer: Medicare Other | Source: Ambulatory Visit | Attending: Radiation Oncology | Admitting: Radiation Oncology

## 2018-01-04 DIAGNOSIS — Z510 Encounter for antineoplastic radiation therapy: Secondary | ICD-10-CM | POA: Diagnosis not present

## 2018-01-04 DIAGNOSIS — C61 Malignant neoplasm of prostate: Secondary | ICD-10-CM | POA: Diagnosis not present

## 2018-01-05 ENCOUNTER — Ambulatory Visit
Admission: RE | Admit: 2018-01-05 | Discharge: 2018-01-05 | Disposition: A | Discharge status: Home / Self Care | Payer: Medicare Other | Source: Ambulatory Visit | Attending: Radiation Oncology | Admitting: Radiation Oncology

## 2018-01-05 DIAGNOSIS — Z510 Encounter for antineoplastic radiation therapy: Secondary | ICD-10-CM | POA: Diagnosis not present

## 2018-01-05 DIAGNOSIS — C61 Malignant neoplasm of prostate: Secondary | ICD-10-CM | POA: Diagnosis not present

## 2018-01-06 ENCOUNTER — Ambulatory Visit
Admission: RE | Admit: 2018-01-06 | Discharge: 2018-01-06 | Disposition: A | Discharge status: Home / Self Care | Payer: Medicare Other | Source: Ambulatory Visit | Attending: Radiation Oncology | Admitting: Radiation Oncology

## 2018-01-06 DIAGNOSIS — C61 Malignant neoplasm of prostate: Secondary | ICD-10-CM | POA: Diagnosis not present

## 2018-01-06 DIAGNOSIS — Z510 Encounter for antineoplastic radiation therapy: Secondary | ICD-10-CM | POA: Diagnosis not present

## 2018-01-09 ENCOUNTER — Ambulatory Visit
Admission: RE | Admit: 2018-01-09 | Discharge: 2018-01-09 | Disposition: A | Discharge status: Home / Self Care | Payer: Medicare Other | Source: Ambulatory Visit | Attending: Radiation Oncology | Admitting: Radiation Oncology

## 2018-01-09 DIAGNOSIS — C61 Malignant neoplasm of prostate: Secondary | ICD-10-CM | POA: Diagnosis not present

## 2018-01-09 DIAGNOSIS — Z510 Encounter for antineoplastic radiation therapy: Secondary | ICD-10-CM | POA: Diagnosis not present

## 2018-01-10 ENCOUNTER — Ambulatory Visit
Admission: RE | Admit: 2018-01-10 | Discharge: 2018-01-10 | Disposition: A | Discharge status: Home / Self Care | Payer: Medicare Other | Source: Ambulatory Visit | Attending: Radiation Oncology | Admitting: Radiation Oncology

## 2018-01-10 DIAGNOSIS — C61 Malignant neoplasm of prostate: Secondary | ICD-10-CM | POA: Diagnosis not present

## 2018-01-10 DIAGNOSIS — Z510 Encounter for antineoplastic radiation therapy: Secondary | ICD-10-CM | POA: Diagnosis not present

## 2018-01-11 ENCOUNTER — Ambulatory Visit
Admission: RE | Admit: 2018-01-11 | Discharge: 2018-01-11 | Disposition: A | Discharge status: Home / Self Care | Payer: Medicare Other | Source: Ambulatory Visit | Attending: Radiation Oncology | Admitting: Radiation Oncology

## 2018-01-11 DIAGNOSIS — Z510 Encounter for antineoplastic radiation therapy: Secondary | ICD-10-CM | POA: Diagnosis not present

## 2018-01-11 DIAGNOSIS — C61 Malignant neoplasm of prostate: Secondary | ICD-10-CM | POA: Diagnosis not present

## 2018-01-12 ENCOUNTER — Ambulatory Visit
Admission: RE | Admit: 2018-01-12 | Discharge: 2018-01-12 | Disposition: A | Discharge status: Home / Self Care | Payer: Medicare Other | Source: Ambulatory Visit | Attending: Radiation Oncology | Admitting: Radiation Oncology

## 2018-01-12 DIAGNOSIS — C61 Malignant neoplasm of prostate: Secondary | ICD-10-CM | POA: Diagnosis not present

## 2018-01-12 DIAGNOSIS — Z510 Encounter for antineoplastic radiation therapy: Secondary | ICD-10-CM | POA: Diagnosis not present

## 2018-01-13 ENCOUNTER — Ambulatory Visit
Admission: RE | Admit: 2018-01-13 | Discharge: 2018-01-13 | Disposition: A | Discharge status: Home / Self Care | Payer: Medicare Other | Source: Ambulatory Visit | Attending: Radiation Oncology | Admitting: Radiation Oncology

## 2018-01-13 DIAGNOSIS — C61 Malignant neoplasm of prostate: Secondary | ICD-10-CM | POA: Diagnosis not present

## 2018-01-13 DIAGNOSIS — Z510 Encounter for antineoplastic radiation therapy: Secondary | ICD-10-CM | POA: Diagnosis not present

## 2018-01-16 ENCOUNTER — Ambulatory Visit: Payer: Medicare Other

## 2018-01-16 ENCOUNTER — Ambulatory Visit
Admission: RE | Admit: 2018-01-16 | Discharge: 2018-01-16 | Disposition: A | Discharge status: Home / Self Care | Payer: Medicare Other | Source: Ambulatory Visit | Attending: Radiation Oncology | Admitting: Radiation Oncology

## 2018-01-16 ENCOUNTER — Other Ambulatory Visit: Payer: Self-pay

## 2018-01-16 ENCOUNTER — Encounter (HOSPITAL_COMMUNITY): Payer: Self-pay | Admitting: *Deleted

## 2018-01-16 ENCOUNTER — Other Ambulatory Visit: Payer: Self-pay | Admitting: Radiation Oncology

## 2018-01-16 ENCOUNTER — Other Ambulatory Visit: Payer: Self-pay | Admitting: *Deleted

## 2018-01-16 ENCOUNTER — Observation Stay (HOSPITAL_COMMUNITY)
Admission: EM | Admit: 2018-01-16 | Discharge: 2018-01-17 | Disposition: A | Discharge status: Home / Self Care | Payer: Medicare Other | Attending: Urology | Admitting: Urology

## 2018-01-16 ENCOUNTER — Telehealth: Payer: Self-pay | Admitting: Medical Oncology

## 2018-01-16 DIAGNOSIS — Z7984 Long term (current) use of oral hypoglycemic drugs: Secondary | ICD-10-CM | POA: Diagnosis not present

## 2018-01-16 DIAGNOSIS — Z923 Personal history of irradiation: Secondary | ICD-10-CM | POA: Insufficient documentation

## 2018-01-16 DIAGNOSIS — Z510 Encounter for antineoplastic radiation therapy: Secondary | ICD-10-CM | POA: Diagnosis not present

## 2018-01-16 DIAGNOSIS — Z79899 Other long term (current) drug therapy: Secondary | ICD-10-CM | POA: Diagnosis not present

## 2018-01-16 DIAGNOSIS — R569 Unspecified convulsions: Secondary | ICD-10-CM | POA: Insufficient documentation

## 2018-01-16 DIAGNOSIS — Z87891 Personal history of nicotine dependence: Secondary | ICD-10-CM | POA: Diagnosis not present

## 2018-01-16 DIAGNOSIS — Z7982 Long term (current) use of aspirin: Secondary | ICD-10-CM | POA: Insufficient documentation

## 2018-01-16 DIAGNOSIS — R339 Retention of urine, unspecified: Secondary | ICD-10-CM | POA: Diagnosis not present

## 2018-01-16 DIAGNOSIS — C61 Malignant neoplasm of prostate: Secondary | ICD-10-CM | POA: Insufficient documentation

## 2018-01-16 DIAGNOSIS — K219 Gastro-esophageal reflux disease without esophagitis: Secondary | ICD-10-CM | POA: Diagnosis not present

## 2018-01-16 DIAGNOSIS — E781 Pure hyperglyceridemia: Secondary | ICD-10-CM | POA: Diagnosis not present

## 2018-01-16 DIAGNOSIS — R300 Dysuria: Secondary | ICD-10-CM

## 2018-01-16 LAB — URINALYSIS, COMPLETE (UACMP) WITH MICROSCOPIC
Bilirubin Urine: NEGATIVE
GLUCOSE, UA: NEGATIVE mg/dL
Hgb urine dipstick: NEGATIVE
Ketones, ur: NEGATIVE mg/dL
Nitrite: NEGATIVE
PH: 6 (ref 5.0–8.0)
Protein, ur: NEGATIVE mg/dL
SPECIFIC GRAVITY, URINE: 1.013 (ref 1.005–1.030)

## 2018-01-16 LAB — URINALYSIS, ROUTINE W REFLEX MICROSCOPIC
Bilirubin Urine: NEGATIVE
Glucose, UA: NEGATIVE mg/dL
Hgb urine dipstick: NEGATIVE
Ketones, ur: NEGATIVE mg/dL
NITRITE: NEGATIVE
PROTEIN: 30 mg/dL — AB
Specific Gravity, Urine: 1.018 (ref 1.005–1.030)
pH: 6 (ref 5.0–8.0)

## 2018-01-16 MED ORDER — LIDOCAINE HCL URETHRAL/MUCOSAL 2 % EX GEL
1.0000 "application " | Freq: Once | CUTANEOUS | Status: AC
  Administered 2018-01-17: 1 via URETHRAL
  Filled 2018-01-16: qty 5

## 2018-01-16 MED ORDER — TAMSULOSIN HCL 0.4 MG PO CAPS: 0.4000 mg | ORAL_CAPSULE | Freq: Every day | ORAL | 5 refills | Status: DC

## 2018-01-16 NOTE — Telephone Encounter (Signed)
Patient called stating he saw Dr. Tammi Klippel this morning and was prescribed Flomax 0.4mg . He took one capsule around 11 am and would like to know if he can take another. He states he feels like his prostate is very swollen and just needs relief. Per Dr. Tammi Klippel he can take an extra capsule this evening. I informed him that his urine shows a few bacteria and white blood cells but indeterminate. When Dr. Tammi Klippel receives culture he will contact him with report.

## 2018-01-16 NOTE — ED Triage Notes (Signed)
Pt reports urinary retention since 0400 today.  Only dribbles every time he tries to urinate.  He denies any abd pain or back pain.  He reports constant burning in his penile area.  Denies hematuria.  He went to have his radiation for prostate ca tx today, was put on Flomax with some relief.  Later in the evening, he started dribbling again with urgency.

## 2018-01-16 NOTE — ED Notes (Signed)
Foley catheter placement attempted by Probation officer and Dr.Messner without success. Urology to be paged for catheter placement.

## 2018-01-16 NOTE — ED Provider Notes (Signed)
Emergency Department Provider Note   I have reviewed the triage vital signs and the nursing notes.   HISTORY  Chief Complaint Urinary Retention   HPI [name] is a 67 y.o. male with history of prostate cancer currently receiving patient treatment last treatment this morning the presents with suprapubic pain and decreased urine output.  Patient has felt the urge to urinate at least once every couple hours if not every hour or more often and it seems to help the pain a little bit but then quickly it comes back.  He has not had normal urine output since 0 400 this morning. No other associated or modifying symptoms.    Past Medical History:  Diagnosis Date  . Arthritis   . Family history of prostate cancer   . GERD (gastroesophageal reflux disease)   . Hyperlipemia   . Hypertriglyceridemia   . Prostate cancer ([place])   . Seizures ([place])    started age 89-last one 40  . Wears glasses     Patient Active Problem List   Diagnosis Date Noted  . Genetic testing 12/02/2017  . Family history of prostate cancer   . Malignant neoplasm of prostate ([place]) 10/18/2017  . Seizures ([place]) 10/09/2012  . Osteoarthritis of left shoulder 10/09/2012    Past Surgical History:  Procedure Laterality Date  . BIOPSY PROSTATE  09/19/2017  . BRONCHOSCOPY  8/10   removal vocal cord polyp  . CAPSULAR RELEASE Left 10/02/2012   Procedure: LEFT ARTHROSCOPY CAPSULAR RELEASE;  Surgeon: Nita Sells, MD;  Location: [facility];  Service: Orthopedics;  Laterality: Left;  . COLONOSCOPY    . KNEE ARTHROSCOPY  5/13   lt  . POLYPECTOMY  1988   vocal cords  . TONSILLECTOMY    . TRANSRECTAL ULTRASOUND  09/19/2017    Current Outpatient Rx  . Order #: [id number] Class: Historical Med  . Order #: [id number] Class: Historical Med  . Order #: [id number] Class: Historical Med  . Order #: [id number] Class: Historical Med  . Order #: [id number] Class: Historical Med  . Order #:  [id number] Class: Historical Med  . Order #: [id number] Class: Historical Med  . Order #: [id number] Class: Historical Med  . Order #: [id number] Class: Normal  . Order #: [id number] Class: Historical Med  . Order #: [id number] Class: Historical Med  . Order #: [id number] Class: Historical Med  . Order #: [id number] Class: Normal    Allergies Patient has no known allergies.  Family History  Problem Relation Age of Onset  . Arthritis Mother   . Dementia Mother   . Cancer - Prostate Father 38  . Cancer - Prostate Brother 21  . Alzheimer's disease Maternal Uncle   . Autism Daughter     Social History Social History   Tobacco Use  . Smoking status: Former Smoker    Packs/day: 0.50    Years: 20.00    Pack years: 10.00    Types: Cigarettes    Last attempt to quit: 04/19/2009    Years since quitting: 8.7  . Smokeless tobacco: Never Used  . Tobacco comment: quit 67 years old  Substance Use Topics  . Alcohol use: No  . Drug use: No    Review of Systems  All other systems negative except as documented in the HPI. All pertinent positives and negatives as reviewed in the HPI. ____________________________________________   PHYSICAL EXAM:  VITAL SIGNS: ED Triage Vitals  Enc Vitals Group     BP 01/16/18 2208 (!) 154/93     Pulse Rate 01/16/18  2208 86     Resp 01/16/18 2208 15     Temp 01/16/18 2208 98.2 F (36.8 C)     Temp Source 01/16/18 2208 Oral     SpO2 01/16/18 2208 97 %    Constitutional: Alert and oriented. Well appearing and in no acute distress. Eyes: Conjunctivae are normal. PERRL. EOMI. Head: Atraumatic. Nose: No congestion/rhinnorhea. Mouth/Throat: Mucous membranes are moist.  Oropharynx non-erythematous. Neck: No stridor.  No meningeal signs.   Cardiovascular: Normal rate, regular rhythm. Good peripheral circulation. Grossly normal heart sounds.   Respiratory: Normal respiratory effort.  No retractions. Lungs CTAB. Gastrointestinal: Soft and ttp in suprapubic. No distention.   Musculoskeletal: No lower extremity tenderness nor edema. No gross deformities of extremities. Neurologic:  Normal speech and language. No gross focal neurologic deficits are appreciated.  Skin:  Skin is warm, dry and intact. No rash noted.  ____________________________________________   LABS (all labs ordered are listed, but only abnormal results are displayed)  Labs Reviewed  URINALYSIS, ROUTINE W REFLEX MICROSCOPIC - Abnormal; Notable for the following components:      Result Value   Protein, ur 30 (*)    Leukocytes, UA TRACE (*)    Bacteria, UA RARE (*)    All other components within normal limits  URINE CULTURE   ____________________________________________   INITIAL IMPRESSION / ASSESSMENT AND PLAN / ED COURSE  Retained 600+ cc of urine.  Will place catheter. [facility] follow up.   Nursing cannot place catheter so I attempted with a 41 [place] coud was able to place either.  A small amount of blood at the tip of the catheter after stopping.  Discussed with Dr. Jeffie Pollock who will see the patient in consultation for catheter placement.  Pertinent labs & imaging results that were available during my care of the patient were reviewed by me and considered in my medical decision making (see chart for details).  ____________________________________________  FINAL CLINICAL IMPRESSION(S) / ED DIAGNOSES  Final diagnoses:  Urinary retention    MEDICATIONS GIVEN DURING THIS VISIT:  Medications  lidocaine (XYLOCAINE) 2 % jelly 1 application (has no administration in time range)    NEW OUTPATIENT MEDICATIONS STARTED DURING THIS VISIT:  New Prescriptions   No medications on file    Note:  This note was prepared with assistance of Dragon voice recognition software. Occasional wrong-word or sound-a-like substitutions may have occurred due to the inherent limitations of voice recognition software.   Merrily Pew, MD 01/16/18 [phone]

## 2018-01-17 ENCOUNTER — Other Ambulatory Visit: Payer: Self-pay

## 2018-01-17 ENCOUNTER — Encounter (HOSPITAL_COMMUNITY): Payer: Self-pay | Admitting: Urology

## 2018-01-17 ENCOUNTER — Ambulatory Visit
Admission: RE | Admit: 2018-01-17 | Discharge: 2018-01-17 | Disposition: A | Discharge status: Home / Self Care | Payer: Medicare Other | Source: Ambulatory Visit | Attending: Radiation Oncology | Admitting: Radiation Oncology

## 2018-01-17 DIAGNOSIS — Z510 Encounter for antineoplastic radiation therapy: Secondary | ICD-10-CM | POA: Insufficient documentation

## 2018-01-17 DIAGNOSIS — R309 Painful micturition, unspecified: Secondary | ICD-10-CM | POA: Diagnosis not present

## 2018-01-17 DIAGNOSIS — C61 Malignant neoplasm of prostate: Secondary | ICD-10-CM | POA: Insufficient documentation

## 2018-01-17 DIAGNOSIS — R339 Retention of urine, unspecified: Secondary | ICD-10-CM | POA: Diagnosis present

## 2018-01-17 DIAGNOSIS — R338 Other retention of urine: Secondary | ICD-10-CM | POA: Diagnosis not present

## 2018-01-17 DIAGNOSIS — N365 Urethral false passage: Secondary | ICD-10-CM | POA: Diagnosis not present

## 2018-01-17 LAB — URINE CULTURE

## 2018-01-17 LAB — HIV ANTIBODY (ROUTINE TESTING W REFLEX): HIV Screen 4th Generation wRfx: NONREACTIVE

## 2018-01-17 MED ORDER — POTASSIUM CHLORIDE IN NACL 20-0.45 MEQ/L-% IV SOLN
INTRAVENOUS | Status: DC
  Administered 2018-01-17: 05:00:00 via INTRAVENOUS
  Filled 2018-01-17: qty 1000

## 2018-01-17 MED ORDER — SIMVASTATIN 40 MG PO TABS: 40.0000 mg | ORAL_TABLET | Freq: Every day | ORAL | Status: DC

## 2018-01-17 MED ORDER — FLEET ENEMA 7-19 GM/118ML RE ENEM: 1.0000 | ENEMA | Freq: Once | RECTAL | Status: DC | PRN

## 2018-01-17 MED ORDER — OXYCODONE HCL 5 MG PO TABS: 5.0000 mg | ORAL_TABLET | ORAL | Status: DC | PRN

## 2018-01-17 MED ORDER — LEVETIRACETAM ER 750 MG PO TB24: 750.0000 mg | ORAL_TABLET | Freq: Every evening | ORAL | Status: DC

## 2018-01-17 MED ORDER — SENNOSIDES-DOCUSATE SODIUM 8.6-50 MG PO TABS: 1.0000 | ORAL_TABLET | Freq: Every evening | ORAL | Status: DC | PRN

## 2018-01-17 MED ORDER — LUBIPROSTONE 24 MCG PO CAPS
24.0000 ug | ORAL_CAPSULE | Freq: Two times a day (BID) | ORAL | Status: DC
  Administered 2018-01-17: 24 ug via ORAL
  Filled 2018-01-17: qty 1

## 2018-01-17 MED ORDER — CIPROFLOXACIN HCL 500 MG PO TABS
500.0000 mg | ORAL_TABLET | Freq: Two times a day (BID) | ORAL | Status: DC
  Administered 2018-01-17: 500 mg via ORAL
  Filled 2018-01-17: qty 1

## 2018-01-17 MED ORDER — CIPROFLOXACIN HCL 500 MG PO TABS: 500.0000 mg | ORAL_TABLET | Freq: Two times a day (BID) | ORAL | 0 refills | Status: DC

## 2018-01-17 MED ORDER — METFORMIN HCL 500 MG PO TABS
500.0000 mg | ORAL_TABLET | Freq: Two times a day (BID) | ORAL | Status: DC
  Administered 2018-01-17: 500 mg via ORAL
  Filled 2018-01-17 (×2): qty 1

## 2018-01-17 MED ORDER — PANTOPRAZOLE SODIUM 40 MG PO TBEC
40.0000 mg | DELAYED_RELEASE_TABLET | Freq: Every day | ORAL | Status: DC
  Filled 2018-01-17: qty 1

## 2018-01-17 MED ORDER — LIDOCAINE HCL 2 % IJ SOLN
INTRAMUSCULAR | Status: AC
  Administered 2018-01-17: 02:00:00
  Filled 2018-01-17: qty 20

## 2018-01-17 MED ORDER — HYOSCYAMINE SULFATE 0.125 MG SL SUBL
0.1250 mg | SUBLINGUAL_TABLET | SUBLINGUAL | Status: DC | PRN
  Filled 2018-01-17: qty 1

## 2018-01-17 MED ORDER — HYDROMORPHONE HCL 1 MG/ML IJ SOLN: 0.5000 mg | INTRAMUSCULAR | Status: DC | PRN

## 2018-01-17 MED ORDER — FENOFIBRATE 160 MG PO TABS
160.0000 mg | ORAL_TABLET | Freq: Every day | ORAL | Status: DC
  Filled 2018-01-17: qty 1

## 2018-01-17 MED ORDER — BISACODYL 10 MG RE SUPP: 10.0000 mg | Freq: Every day | RECTAL | Status: DC | PRN

## 2018-01-17 MED ORDER — BISACODYL 10 MG RE SUPP: 10.0000 mg | Freq: Every day | RECTAL | 0 refills | Status: DC | PRN

## 2018-01-17 MED ORDER — ACETAMINOPHEN 325 MG PO TABS: 650.0000 mg | ORAL_TABLET | ORAL | Status: DC | PRN

## 2018-01-17 MED ORDER — LIDOCAINE HCL 2 % IJ SOLN: 20.0000 mL | Freq: Once | INTRAMUSCULAR | Status: DC

## 2018-01-17 MED ORDER — LEVETIRACETAM 500 MG PO TABS
750.0000 mg | ORAL_TABLET | Freq: Two times a day (BID) | ORAL | Status: DC
  Filled 2018-01-17: qty 1

## 2018-01-17 MED ORDER — ONDANSETRON HCL 4 MG/2ML IJ SOLN: 4.0000 mg | INTRAMUSCULAR | Status: DC | PRN

## 2018-01-17 NOTE — H&P (Signed)
Subjective: CC:  Difficulty voiding.  Hx: [name] is a 67 yo WM who I was asked to see by Dr. Dayna Barker for urinary retention.  He is a patient of Dr. Louis Meckel and has prostate cancer that is actively being treated by Dr. Tammi Klippel with radiation therapy.  He had the onset early this morning of difficult voiding with frequent small voids and suprapubic discomfort.  He has taken 2 tamsulosin with only minimal relief.   His PVR in the ER was 649ml and multiple attempts at passage of a straight and coude foley were unsuccessful.  He has had no prior GU surgery other than the biopsy.    ROS:  Review of Systems  Constitutional: Positive for diaphoresis.  Gastrointestinal: Positive for abdominal pain.  All other systems reviewed and are negative.   No Known Allergies  Past Medical History:  Diagnosis Date  . Arthritis   . Family history of prostate cancer   . GERD (gastroesophageal reflux disease)   . Hyperlipemia   . Hypertriglyceridemia   . Prostate cancer ([place])   . Seizures ([place])    started age 39-last one 49  . Wears glasses     Past Surgical History:  Procedure Laterality Date  . BIOPSY PROSTATE  09/19/2017  . BRONCHOSCOPY  8/10   removal vocal cord polyp  . CAPSULAR RELEASE Left 10/02/2012   Procedure: LEFT ARTHROSCOPY CAPSULAR RELEASE;  Surgeon: Nita Sells, MD;  Location: [facility];  Service: Orthopedics;  Laterality: Left;  . COLONOSCOPY    . KNEE ARTHROSCOPY  5/13   lt  . POLYPECTOMY  1988   vocal cords  . TONSILLECTOMY    . TRANSRECTAL ULTRASOUND  09/19/2017    Social History   Socioeconomic History  . Marital status: Married    Spouse name: [name]  . Number of children: 2  . Years of education: college  . Highest education level: Not on file  Occupational History  . Occupation: Pharmacist, hospital     Comment: retired  Scientific laboratory technician  . Financial resource strain: Not on file  . Food insecurity:    Worry: Not on file    Inability: Not on  file  . Transportation needs:    Medical: Not on file    Non-medical: Not on file  Tobacco Use  . Smoking status: Former Smoker    Packs/day: 0.50    Years: 20.00    Pack years: 10.00    Types: Cigarettes    Last attempt to quit: 04/19/2009    Years since quitting: 8.7  . Smokeless tobacco: Never Used  . Tobacco comment: quit 67 years old  Substance and Sexual Activity  . Alcohol use: No  . Drug use: No  . Sexual activity: Not Currently  Lifestyle  . Physical activity:    Days per week: Not on file    Minutes per session: Not on file  . Stress: Not on file  Relationships  . Social connections:    Talks on phone: Not on file    Gets together: Not on file    Attends religious service: Not on file    Active member of club or organization: Not on file    Attends meetings of clubs or organizations: Not on file    Relationship status: Not on file  . Intimate partner violence:    Fear of current or ex partner: Not on file    Emotionally abused: Not on file    Physically abused: Not on file  Forced sexual activity: Not on file  Other Topics Concern  . Not on file  Social History Narrative   Patient lives at home with his wife [name]). Patient has two children. Patient is retired Education officer, museum. Patient drinks caffeine daily.    Both handed.          Family History  Problem Relation Age of Onset  . Arthritis Mother   . Dementia Mother   . Cancer - Prostate Father 61  . Cancer - Prostate Brother 60  . Alzheimer's disease Maternal Uncle   . Autism Daughter     Anti-infectives: Anti-infectives (From admission, onward)   None      Current Facility-Administered Medications  Medication Dose Route Frequency Provider Last Rate Last Dose  . lidocaine (XYLOCAINE) 2 % (with pres) injection 400 mg  20 mL Intradermal Once Irine Seal, MD      . lidocaine (XYLOCAINE) 2 % jelly 1 application  1 application Urethral Once Mesner, Corene Cornea, MD       Current Outpatient  Medications  Medication Sig Dispense Refill  . aspirin 81 MG tablet Take 81 mg by mouth daily.    . calcium carbonate (OS-CAL) 600 MG TABS Take by mouth 2 (two) times daily with a meal. 1,280 mg. qd    . Dexlansoprazole (DEXILANT PO) Take by mouth.    . Dexlansoprazole 30 MG capsule Take 30 mg by mouth daily.    . fenofibrate (TRICOR) 145 MG tablet Take 160 mg by mouth daily.     . folic acid (FOLVITE) 425 MCG tablet Take 400 mcg by mouth daily.    . hydrocortisone (ANUSOL-HC) 25 MG suppository Place 25 mg rectally 2 (two) times daily as needed.     . levETIRAcetam (KEPPRA) 750 MG tablet Take 750 mg by mouth 2 (two) times daily.    . Levetiracetam 750 MG TB24 take 1 tablet by mouth every evening 30 tablet 0  . lubiprostone (AMITIZA) 24 MCG capsule Take 24 mcg by mouth 2 (two) times daily with a meal.    . metFORMIN (GLUCOPHAGE) 500 MG tablet Take by mouth 2 (two) times daily with a meal.    . simvastatin (ZOCOR) 20 MG tablet Take 40 mg by mouth daily.     . tamsulosin (FLOMAX) 0.4 MG CAPS capsule Take 1 capsule (0.4 mg total) by mouth daily after supper. 30 capsule 5     Objective: Vital signs in last 24 hours: Temp:  [98.2 F (36.8 C)] 98.2 F (36.8 C) (09/30 2208) Pulse Rate:  [86] 86 (09/30 2208) Resp:  [15] 15 (09/30 2208) BP: (154)/(93) 154/93 (09/30 2208) SpO2:  [97 %] 97 % (09/30 2208)  Intake/Output from previous day: 09/30 0701 - 10/01 0700 In: -  Out: 626 [Urine:626] Intake/Output this shift: Total I/O In: -  Out: 626 [Urine:626]   Physical Exam  Constitutional: He is oriented to person, place, and time. He appears well-developed and well-nourished. He appears distressed (with severe SP pain. ).  Cardiovascular: Normal rate and regular rhythm.  Pulmonary/Chest: Effort normal. No respiratory distress.  Abdominal: He exhibits mass (in suprapubic area. ). There is tenderness. There is guarding.  Genitourinary: Penis normal.  Musculoskeletal: Normal range of motion.  He exhibits no edema or tenderness.  Neurological: He is alert and oriented to person, place, and time.  Skin: Skin is warm and dry.  Psychiatric:  Agitated with pain.   Vitals reviewed.   Lab Results:  No results for input(s): WBC, HGB, HCT, PLT  in the last 72 hours. BMET No results for input(s): NA, K, CL, CO2, GLUCOSE, BUN, CREATININE, CALCIUM in the last 72 hours. PT/INR No results for input(s): LABPROT, INR in the last 72 hours. ABG No results for input(s): PHART, HCO3 in the last 72 hours.  Invalid input(s): PCO2, PO2  Studies/Results: No results found.  Attempt at 87fr coude placement after betadine prep and urethral lubrication was unsuccessful and there was urethral bleeding post attempt consistent with a urethral false passage.    He was placed in the trendelenberg position and his lower abdomen was prepped with betadine and draped with sterile towels.  35ml of 2% lidocaine was injected 3-4cm above the pubis with a 25g needle.  The Bonnano SP tube was assembled over the insertion needle and the needle was passed through the anesthetized area into the bladder.  When urine returned the tube was advanced over the needle into the bladder.  The adapter was placed and the tube was placed to a drainage bag.  I then secured the tube to the skin with 4 3-0 chromic stitches.  A dressing was applied.   Assessment: AUR with urethral false passage.   Bonnano SP tube placed.  He will kept for a few hours for observation.  Prostate cancer undergoing radiation therapy.  He will need to hold radiation therapy tomorrow and then resume when Dr. Tammi Klippel has evaluated him.    CC: Dr. Merrily Pew and Dr. Burman Nieves.      Irine Seal 01/17/2018 [id number]

## 2018-01-17 NOTE — ED Notes (Signed)
Consent for procedure complete and signed in pt chart

## 2018-01-17 NOTE — Discharge Summary (Signed)
Date of admission: 01/16/2018  Date of discharge: 01/17/2018  Admission diagnosis: urinary retention  Discharge diagnosis: same  Secondary diagnoses:  Patient Active Problem List   Diagnosis Date Noted  . Urinary retention 01/17/2018  . Genetic testing 12/02/2017  . Family history of prostate cancer   . Malignant neoplasm of prostate ([place]) 10/18/2017  . Seizures ([place]) 10/09/2012  . Osteoarthritis of left shoulder 10/09/2012    Procedures performed:   History and Physical: For full details, please see admission history and physical. Briefly, [name] is a 67 y.o. year old patient with prostate cancer undergoing external beam radiation.  He developed urinary retention and had several false passages in the ED before an SPT was placed.   Hospital Course: Patient tolerated the procedure well.  He was then transferred to the floor after an uneventful PACU stay.  His hospital course was uncomplicated.  On HD #1 he had met discharge criteria: was eating a regular diet, was up and ambulating independently,  pain was well controlled,  and was ready to for discharge.  Instructions were provided on catheter management.    Laboratory values:  No results for input(s): WBC, HGB, HCT in the last 72 hours. No results for input(s): NA, K, CL, CO2, GLUCOSE, BUN, CREATININE, CALCIUM in the last 72 hours. No results for input(s): LABPT, INR in the last 72 hours. No results for input(s): LABURIN in the last 72 hours. Results for orders placed or performed during the hospital encounter of 11/19/08  Culture, respiratory     Status: None   Collection Time: 11/19/08  7:54 AM  Result Value Ref Range Status   Specimen Description BRONCHIAL WASHINGS  Final   Special Requests NONE  Final   Gram Stain   Final    NO WBC SEEN NO SQUAMOUS EPITHELIAL CELLS SEEN NO ORGANISMS SEEN   Culture Non-Pathogenic Oropharyngeal-type [place]ora Isolated.  Final   Report Status 11/21/2008 FINAL  Final     Disposition: Home  Discharge instruction: The patient was instructed to be ambulatory but told to refrain from heavy lifting, strenuous activity, or driving.   Discharge medications:  Allergies as of 01/17/2018   No Known Allergies     Medication List    TAKE these medications   aspirin 81 MG tablet Take 81 mg by mouth daily.   bisacodyl 10 MG suppository Commonly known as:  DULCOLAX Place 1 suppository (10 mg total) rectally daily as needed for moderate constipation.   calcium carbonate 600 MG Tabs tablet Commonly known as:  OS-CAL Take 1,200 mg by mouth 2 (two) times daily with a meal.   ciprofloxacin 500 MG tablet Commonly known as:  CIPRO Take 1 tablet (500 mg total) by mouth 2 (two) times daily.   DEXILANT 60 MG capsule Generic drug:  dexlansoprazole Take 60 mg by mouth daily.   fenofibrate 145 MG tablet Commonly known as:  TRICOR Take 160 mg by mouth daily.   folic acid 161 MCG tablet Commonly known as:  FOLVITE Take 400 mcg by mouth daily.   Levetiracetam 750 MG Tb24 take 1 tablet by mouth every evening   levETIRAcetam 750 MG tablet Commonly known as:  KEPPRA Take 750 mg by mouth 2 (two) times daily.   lubiprostone 24 MCG capsule Commonly known as:  AMITIZA Take 24 mcg by mouth 2 (two) times daily with a meal.   metFORMIN 500 MG tablet Commonly known as:  GLUCOPHAGE Take 1,000 mg by mouth 2 (two) times daily with a  meal.   PRESERVISION AREDS PO Take 1 capsule by mouth 2 (two) times daily.   simvastatin 40 MG tablet Commonly known as:  ZOCOR Take 40 mg by mouth daily.   tamsulosin 0.4 MG Caps capsule Commonly known as:  FLOMAX Take 1 capsule (0.4 mg total) by mouth daily after supper.       Followup:  Follow-up Information    [facility]. Schedule an appointment as soon as possible for a visit in 1 week(s).   Contact information: [place] [phone]

## 2018-01-17 NOTE — Discharge Instructions (Signed)

## 2018-01-17 NOTE — ED Notes (Signed)
ED TO INPATIENT HANDOFF REPORT  Name/Age/Gender [name] 67 y.o. male  Code Status   Home/SNF/Other Home  Chief Complaint Cancer Patient (Under Radiation)/Urinary Issue (Trouble Voiding Bladder)   Level of Care/Admitting Diagnosis ED Disposition    ED Disposition Condition [facility] Area: [facility] [[id number]]  Level of Care: Med-Surg [16]  Diagnosis: Urinary retention [355732]  Admitting Physician: Irine Seal [[id number]]  Attending Physician: Irine Seal [[id number]]  PT Class (Do Not Modify): Observation [104]  PT Acc Code (Do Not Modify): Observation [10022]       Medical History Past Medical History:  Diagnosis Date  . Arthritis   . Family history of prostate cancer   . GERD (gastroesophageal reflux disease)   . Hyperlipemia   . Hypertriglyceridemia   . Prostate cancer ([place])   . Seizures ([place])    started age 40-last one 43  . Wears glasses     Allergies No Known Allergies  IV Location/Drains/Wounds Patient Lines/Drains/Airways Status   Active Line/Drains/Airways    Name:   Placement date:   Placement time:   Site:   Days:   Incision 10/02/12 Shoulder Left   10/02/12    1239     1933          Labs/Imaging Results for orders placed or performed during the hospital encounter of 01/16/18 (from the past 48 hour(s))  Urinalysis, Routine w reflex microscopic- may I&O cath if menses     Status: Abnormal   Collection Time: 01/16/18 10:54 PM  Result Value Ref Range   Color, Urine YELLOW YELLOW   APPearance CLEAR CLEAR   Specific Gravity, Urine 1.018 1.005 - 1.030   pH 6.0 5.0 - 8.0   Glucose, UA NEGATIVE NEGATIVE mg/dL   Hgb urine dipstick NEGATIVE NEGATIVE   Bilirubin Urine NEGATIVE NEGATIVE   Ketones, ur NEGATIVE NEGATIVE mg/dL   Protein, ur 30 (A) NEGATIVE mg/dL   Nitrite NEGATIVE NEGATIVE   Leukocytes, UA TRACE (A) NEGATIVE   RBC / HPF 6-10 0 - 5 RBC/hpf   WBC, UA 6-10 0 - 5 WBC/hpf   Bacteria, UA RARE (A)  NONE SEEN   Squamous Epithelial / LPF 0-5 0 - 5   Mucus PRESENT     Comment: Performed at [facility], [place] [address] [place], [place] [address]   No results found.  Pending Labs Unresulted Labs (From admission, onward)    Start     Ordered   01/16/18 2240  Urine culture  STAT,   STAT     01/16/18 2239   Signed and Held  HIV antibody (Routine Testing)  Once,   R     Signed and Held          Vitals/Pain Today's Vitals   01/16/18 2208 01/16/18 2227  BP: (!) 154/93   Pulse: 86   Resp: 15   Temp: 98.2 F (36.8 C)   TempSrc: Oral   SpO2: 97%   PainSc:  4     Isolation Precautions No active isolations  Medications Medications  lidocaine (XYLOCAINE) 2 % (with pres) injection 400 mg (400 mg Intradermal Not Given 01/17/18 0204)  lidocaine (XYLOCAINE) 2 % jelly 1 application (1 application Urethral Given by Other 01/17/18 0202)  lidocaine (XYLOCAINE) 2 % (with pres) injection (  Given by Other 01/17/18 0204)    Mobility walks

## 2018-01-18 ENCOUNTER — Ambulatory Visit: Payer: Medicare Other

## 2018-01-18 ENCOUNTER — Ambulatory Visit
Admission: RE | Admit: 2018-01-18 | Discharge: 2018-01-18 | Disposition: A | Discharge status: Home / Self Care | Payer: Medicare Other | Source: Ambulatory Visit | Attending: Radiation Oncology | Admitting: Radiation Oncology

## 2018-01-18 DIAGNOSIS — C61 Malignant neoplasm of prostate: Secondary | ICD-10-CM | POA: Diagnosis not present

## 2018-01-18 DIAGNOSIS — Z510 Encounter for antineoplastic radiation therapy: Secondary | ICD-10-CM | POA: Diagnosis not present

## 2018-01-18 DIAGNOSIS — R309 Painful micturition, unspecified: Secondary | ICD-10-CM | POA: Diagnosis not present

## 2018-01-18 DIAGNOSIS — R338 Other retention of urine: Secondary | ICD-10-CM | POA: Diagnosis not present

## 2018-01-18 LAB — URINE CULTURE: Culture: <10000 — AB

## 2018-01-19 ENCOUNTER — Ambulatory Visit
Admission: RE | Admit: 2018-01-19 | Discharge: 2018-01-19 | Disposition: A | Discharge status: Home / Self Care | Payer: Medicare Other | Source: Ambulatory Visit | Attending: Radiation Oncology | Admitting: Radiation Oncology

## 2018-01-19 DIAGNOSIS — C61 Malignant neoplasm of prostate: Secondary | ICD-10-CM | POA: Diagnosis not present

## 2018-01-19 DIAGNOSIS — Z510 Encounter for antineoplastic radiation therapy: Secondary | ICD-10-CM | POA: Diagnosis not present

## 2018-01-19 LAB — GLUCOSE, CAPILLARY: Glucose-Capillary: 112 mg/dL — ABNORMAL HIGH (ref 70–99)

## 2018-01-19 NOTE — Progress Notes (Signed)
Please call patient with normal result.  Thanks. MM 

## 2018-01-20 ENCOUNTER — Ambulatory Visit
Admission: RE | Admit: 2018-01-20 | Discharge: 2018-01-20 | Disposition: A | Discharge status: Home / Self Care | Payer: Medicare Other | Source: Ambulatory Visit | Attending: Radiation Oncology | Admitting: Radiation Oncology

## 2018-01-20 DIAGNOSIS — Z510 Encounter for antineoplastic radiation therapy: Secondary | ICD-10-CM | POA: Diagnosis not present

## 2018-01-20 DIAGNOSIS — C61 Malignant neoplasm of prostate: Secondary | ICD-10-CM | POA: Diagnosis not present

## 2018-01-23 ENCOUNTER — Ambulatory Visit: Payer: Medicare Other

## 2018-01-23 ENCOUNTER — Ambulatory Visit
Admission: RE | Admit: 2018-01-23 | Discharge: 2018-01-23 | Disposition: A | Discharge status: Home / Self Care | Payer: Medicare Other | Source: Ambulatory Visit | Attending: Radiation Oncology | Admitting: Radiation Oncology

## 2018-01-23 ENCOUNTER — Encounter: Payer: Self-pay | Admitting: Radiation Oncology

## 2018-01-23 DIAGNOSIS — C61 Malignant neoplasm of prostate: Secondary | ICD-10-CM | POA: Diagnosis not present

## 2018-01-23 DIAGNOSIS — Z510 Encounter for antineoplastic radiation therapy: Secondary | ICD-10-CM | POA: Diagnosis not present

## 2018-01-24 ENCOUNTER — Ambulatory Visit: Payer: Medicare Other

## 2018-01-26 DIAGNOSIS — R338 Other retention of urine: Secondary | ICD-10-CM | POA: Diagnosis not present

## 2018-01-26 NOTE — Progress Notes (Signed)
  Radiation Oncology         [phone]) [phone] ________________________________  Name: [name] MRN: [id number]  Date: 01/23/2018  DOB: February 07, 1951  End of Treatment Note  Diagnosis:   67 y.o. male with unfavorable, intermediaterisk, Stage T1cadenocarcinoma of the prostate with Gleason Score of 4+3, and PSA of4.65    Indication for treatment:  Curative, Definitive Radiotherapy       Radiation treatment dates:   12/14/2017 - 01/23/2018  Site/dose:   The prostate was treated to 70 Gy in 28 fractions of 2.5 Gy  Beams/energy:   The patient was treated with IMRT using volumetric arc therapy delivering 6 MV X-rays to clockwise and counterclockwise circumferential arcs with a 90 degree collimator offset to avoid dose scalloping.  Image guidance was performed with daily cone beam CT prior to each fraction to align to gold markers in the prostate and assure proper bladder and rectal fill volumes.  Immobilization was achieved with BodyFix custom mold.  Narrative: The patient tolerated radiation treatment relatively well.   He did experience urinary irritation with increased frequency, urgency, leakage, straining, weak stream, incomplete emptying, nocturia x3-4, and dysuria. He denied having hematuria. He was prescribed Flomax for his symptoms. Urinanalysis came back with a few white blood cells, but nothing concerning. Urine culture was negative. He developed urinary retention towards the end of treatment and had a foley catheter placed. He noted less dysuria with the catheter. He denied any bowel issues.  Plan: The patient has completed radiation treatment. He will return to radiation oncology clinic for routine followup in one month. I advised him to call or return sooner if he has any questions or concerns related to his recovery or treatment. ________________________________  Sheral Apley. Tammi Klippel, M.D.  This document serves as a record of services personally performed by Tyler Pita, MD. It  was created on his behalf by Rae Lips, a trained medical scribe. The creation of this record is based on the scribe's personal observations and the provider's statements to them. This document has been checked and approved by the attending provider.

## 2018-01-31 DIAGNOSIS — R3916 Straining to void: Secondary | ICD-10-CM | POA: Diagnosis not present

## 2018-01-31 DIAGNOSIS — R338 Other retention of urine: Secondary | ICD-10-CM | POA: Diagnosis not present

## 2018-02-07 DIAGNOSIS — M19041 Primary osteoarthritis, right hand: Secondary | ICD-10-CM | POA: Diagnosis not present

## 2018-02-07 DIAGNOSIS — M25549 Pain in joints of unspecified hand: Secondary | ICD-10-CM | POA: Insufficient documentation

## 2018-02-07 DIAGNOSIS — M79641 Pain in right hand: Secondary | ICD-10-CM | POA: Diagnosis not present

## 2018-02-14 DIAGNOSIS — R338 Other retention of urine: Secondary | ICD-10-CM | POA: Diagnosis not present

## 2018-02-14 DIAGNOSIS — Z8546 Personal history of malignant neoplasm of prostate: Secondary | ICD-10-CM | POA: Diagnosis not present

## 2018-02-15 DIAGNOSIS — Z8546 Personal history of malignant neoplasm of prostate: Secondary | ICD-10-CM | POA: Diagnosis not present

## 2018-02-15 DIAGNOSIS — R338 Other retention of urine: Secondary | ICD-10-CM | POA: Diagnosis not present

## 2018-02-22 DIAGNOSIS — R338 Other retention of urine: Secondary | ICD-10-CM | POA: Diagnosis not present

## 2018-02-28 DIAGNOSIS — R338 Other retention of urine: Secondary | ICD-10-CM | POA: Diagnosis not present

## 2018-02-28 DIAGNOSIS — Z8546 Personal history of malignant neoplasm of prostate: Secondary | ICD-10-CM | POA: Diagnosis not present

## 2018-03-01 ENCOUNTER — Ambulatory Visit
Admission: RE | Admit: 2018-03-01 | Discharge: 2018-03-01 | Disposition: A | Discharge status: Home / Self Care | Payer: Medicare Other | Source: Ambulatory Visit | Attending: Urology | Admitting: Urology

## 2018-03-01 ENCOUNTER — Other Ambulatory Visit: Payer: Self-pay

## 2018-03-01 ENCOUNTER — Encounter: Payer: Self-pay | Admitting: Urology

## 2018-03-01 VITALS — BP 129/74 | HR 77 | Temp 97.6°F | Resp 20 | Ht 72.0 in | Wt 194.8 lb

## 2018-03-01 DIAGNOSIS — G629 Polyneuropathy, unspecified: Secondary | ICD-10-CM | POA: Diagnosis not present

## 2018-03-01 DIAGNOSIS — K5900 Constipation, unspecified: Secondary | ICD-10-CM | POA: Diagnosis not present

## 2018-03-01 DIAGNOSIS — E1122 Type 2 diabetes mellitus with diabetic chronic kidney disease: Secondary | ICD-10-CM | POA: Diagnosis not present

## 2018-03-01 DIAGNOSIS — K219 Gastro-esophageal reflux disease without esophagitis: Secondary | ICD-10-CM | POA: Diagnosis not present

## 2018-03-01 DIAGNOSIS — C61 Malignant neoplasm of prostate: Secondary | ICD-10-CM

## 2018-03-01 DIAGNOSIS — G40909 Epilepsy, unspecified, not intractable, without status epilepticus: Secondary | ICD-10-CM | POA: Diagnosis not present

## 2018-03-01 DIAGNOSIS — Z923 Personal history of irradiation: Secondary | ICD-10-CM | POA: Diagnosis not present

## 2018-03-01 DIAGNOSIS — I444 Left anterior fascicular block: Secondary | ICD-10-CM | POA: Diagnosis not present

## 2018-03-01 DIAGNOSIS — Z7984 Long term (current) use of oral hypoglycemic drugs: Secondary | ICD-10-CM | POA: Diagnosis not present

## 2018-03-01 DIAGNOSIS — Z1389 Encounter for screening for other disorder: Secondary | ICD-10-CM | POA: Diagnosis not present

## 2018-03-01 DIAGNOSIS — Z23 Encounter for immunization: Secondary | ICD-10-CM | POA: Diagnosis not present

## 2018-03-01 DIAGNOSIS — E782 Mixed hyperlipidemia: Secondary | ICD-10-CM | POA: Diagnosis not present

## 2018-03-01 DIAGNOSIS — Z0000 Encounter for general adult medical examination without abnormal findings: Secondary | ICD-10-CM | POA: Diagnosis not present

## 2018-03-01 DIAGNOSIS — Z79899 Other long term (current) drug therapy: Secondary | ICD-10-CM | POA: Insufficient documentation

## 2018-03-01 NOTE — Progress Notes (Signed)
Radiation Oncology         [phone]) [phone] ________________________________  Name: [name] MRN: [id number]  Date: 03/01/2018  DOB: 17-May-1950  Post Treatment Note  CC: Maury Dus, MD  Ardis Hughs, MD  Diagnosis:   67 y.o. male with unfavorable, intermediaterisk, Stage T1cadenocarcinoma of the prostate with Gleason Score of 4+3, and PSA of4.65    Interval Since Last Radiation:  5 weeks  12/14/2017 - 01/23/2018:  The prostate was treated to 70 Gy in 28 fractions of 2.5 Gy   Narrative:  The patient returns today for routine follow-up. He tolerated radiation treatment relatively well.   He did experience urinary irritation with increased frequency, urgency, leakage, straining, weak stream, incomplete emptying, nocturia x3-4, and dysuria. He denied having hematuria. He was prescribed Flomax for his symptoms. Urinalysis came back with a few white blood cells, but nothing concerning and Urine culture was negative. Unfortunately, he developed urinary retention towards the end of treatment requiring placement of SP tube. He noted less dysuria with the SP catheter. He denied any bowel issues.                              On review of systems, the patient states he is doing well overall.  He reports that his SP catheter was removed 2-3 weeks ago after successful voiding trial in office at [place].  Unfortunately, he had to present back to the office the following day for Foley Catheter placement due to AUR with 900 ml of urine in bladder on post void residual.  His FC was removed in the office on 02/28/18 and he has been able to void without issue since that time.  He has continued taking Flomax daily as prescribed.  Currently, he reports mild dysuria at start of stream but denies straining to void, gross hematuria, excessive daytime frequency, urgency, fever, chills, N/V or abdominal pain.  He has not had incontinence. Currently, he is pleased with his progress.  ALLERGIES:  has No Known  Allergies.  Meds: Current Outpatient Medications  Medication Sig Dispense Refill  . aspirin 81 MG tablet Take 81 mg by mouth daily.    . bisacodyl (DULCOLAX) 10 MG suppository Place 1 suppository (10 mg total) rectally daily as needed for moderate constipation. 12 suppository 0  . calcium carbonate (OS-CAL) 600 MG TABS Take 1,200 mg by mouth 2 (two) times daily with a meal.     . fenofibrate (TRICOR) 145 MG tablet Take 160 mg by mouth daily.     . folic acid (FOLVITE) 884 MCG tablet Take 400 mcg by mouth daily.    Marland Kitchen levETIRAcetam (KEPPRA) 750 MG tablet Take 750 mg by mouth 2 (two) times daily.    Marland Kitchen lubiprostone (AMITIZA) 24 MCG capsule Take 24 mcg by mouth 2 (two) times daily with a meal.    . metFORMIN (GLUCOPHAGE) 500 MG tablet Take 1,000 mg by mouth 2 (two) times daily with a meal.     . Multiple Vitamins-Minerals (PRESERVISION AREDS PO) Take 1 capsule by mouth 2 (two) times daily.    . pantoprazole (PROTONIX) 40 MG tablet Take 40 mg by mouth daily.    . simvastatin (ZOCOR) 40 MG tablet Take 40 mg by mouth daily.    . tamsulosin (FLOMAX) 0.4 MG CAPS capsule Take 1 capsule (0.4 mg total) by mouth daily after supper. 30 capsule 5  . Levetiracetam 750 MG TB24 take 1 tablet by mouth  every evening (Patient not taking: Reported on 01/17/2018) 30 tablet 0   No current facility-administered medications for this encounter.     Physical Findings:  height is 6' (1.829 m) and weight is 194 lb 12.8 oz (88.4 kg). His oral temperature is 97.6 F (36.4 C). His blood pressure is 129/74 and his pulse is 77. His respiration is 20 and oxygen saturation is 100%.  Pain Assessment Pain Score: 0-No pain/10 In general this is a well appearing Caucasian male in no acute distress. He's alert and oriented x4 and appropriate throughout the examination. Cardiopulmonary assessment is negative for acute distress and he exhibits normal effort.   Lab Findings: Lab Results  Component Value Date   WBC 6.2 11/18/2008     HGB 15.5 10/02/2012   HCT 44.6 11/18/2008   MCV 96.7 11/18/2008   PLT 198 11/18/2008     Radiographic Findings: No results found.  Impression/Plan: 1. 67 y.o. male with unfavorable, intermediaterisk, Stage T1cadenocarcinoma of the prostate with Gleason Score of 4+3, and PSA of4.65. He will continue to follow up with urology for ongoing PSA determinations and has an appointment scheduled with Dr. Louis Meckel in 05/2018. I advised him to continue Flomax for a minimum of 3-4 weeks before attempting to discontinue.  He is in agreement and has refills available. He understands what to expect with regards to PSA monitoring going forward. I will look forward to following his response to treatment via correspondence with urology, and would be happy to continue to participate in his care if clinically indicated. I talked to the patient about what to expect in the future, including his risk for erectile dysfunction and rectal bleeding. I encouraged him to call or return to the office if he has any questions regarding his previous radiation or possible radiation side effects. He was comfortable with this plan and will follow up as needed.      Nicholos Johns, PA-C

## 2018-03-29 DIAGNOSIS — M7502 Adhesive capsulitis of left shoulder: Secondary | ICD-10-CM | POA: Diagnosis not present

## 2018-04-05 DIAGNOSIS — M722 Plantar fascial fibromatosis: Secondary | ICD-10-CM | POA: Diagnosis not present

## 2018-04-06 DIAGNOSIS — M7502 Adhesive capsulitis of left shoulder: Secondary | ICD-10-CM | POA: Diagnosis not present

## 2018-04-06 DIAGNOSIS — J069 Acute upper respiratory infection, unspecified: Secondary | ICD-10-CM | POA: Diagnosis not present

## 2018-04-11 DIAGNOSIS — M7502 Adhesive capsulitis of left shoulder: Secondary | ICD-10-CM | POA: Diagnosis not present

## 2018-04-14 DIAGNOSIS — G40909 Epilepsy, unspecified, not intractable, without status epilepticus: Secondary | ICD-10-CM | POA: Diagnosis not present

## 2018-04-14 DIAGNOSIS — M7502 Adhesive capsulitis of left shoulder: Secondary | ICD-10-CM | POA: Diagnosis not present

## 2018-04-14 DIAGNOSIS — Z111 Encounter for screening for respiratory tuberculosis: Secondary | ICD-10-CM | POA: Diagnosis not present

## 2018-04-17 DIAGNOSIS — C4441 Basal cell carcinoma of skin of scalp and neck: Secondary | ICD-10-CM | POA: Diagnosis not present

## 2018-04-17 DIAGNOSIS — M7502 Adhesive capsulitis of left shoulder: Secondary | ICD-10-CM | POA: Diagnosis not present

## 2018-04-18 DIAGNOSIS — M7502 Adhesive capsulitis of left shoulder: Secondary | ICD-10-CM | POA: Diagnosis not present

## 2018-05-17 ENCOUNTER — Encounter: Payer: Self-pay | Admitting: Skilled Nursing Facility1

## 2018-05-17 ENCOUNTER — Encounter: Payer: Medicare Other | Attending: Physician Assistant | Admitting: Skilled Nursing Facility1

## 2018-05-17 DIAGNOSIS — G40909 Epilepsy, unspecified, not intractable, without status epilepticus: Secondary | ICD-10-CM | POA: Diagnosis not present

## 2018-05-17 DIAGNOSIS — Z713 Dietary counseling and surveillance: Secondary | ICD-10-CM | POA: Insufficient documentation

## 2018-05-17 DIAGNOSIS — Z79899 Other long term (current) drug therapy: Secondary | ICD-10-CM | POA: Diagnosis not present

## 2018-05-17 DIAGNOSIS — N183 Chronic kidney disease, stage 3 unspecified: Secondary | ICD-10-CM

## 2018-05-17 DIAGNOSIS — M069 Rheumatoid arthritis, unspecified: Secondary | ICD-10-CM | POA: Diagnosis not present

## 2018-05-17 DIAGNOSIS — Z7982 Long term (current) use of aspirin: Secondary | ICD-10-CM | POA: Diagnosis not present

## 2018-05-17 DIAGNOSIS — E1122 Type 2 diabetes mellitus with diabetic chronic kidney disease: Secondary | ICD-10-CM | POA: Insufficient documentation

## 2018-05-17 DIAGNOSIS — Z8546 Personal history of malignant neoplasm of prostate: Secondary | ICD-10-CM | POA: Insufficient documentation

## 2018-05-17 DIAGNOSIS — Z7984 Long term (current) use of oral hypoglycemic drugs: Secondary | ICD-10-CM | POA: Insufficient documentation

## 2018-05-17 NOTE — Progress Notes (Signed)
Pt states he finished prostate cancer treatment in October.  Pt states his average blood sugar is 115.  Pt states he is having a flare up of plantar faucitis and has neuropathy which he gets electro therapy through a chiropractor.  Pt states his blood sugar is 6.1. Pt states he does have bursitis.  Pt states he needs rest he is very tired.  Pt states he swims 9-10 days in a row at 45 minutes.  Pt states he currently has a frozen shoulder.  Pt states he checks his blood sugar every morning before breakfast.  Pt states he works with his chiropractor for preventive spine disease.  Pt states he takes herbal drops for inflammation.  Pt states he has CKD stage 3.  Pt states he does not drink plain water because he does not like it.  Pt states he takes 1200mg  of calcium a day all at once.  Pt states he just wants to get some specific information on how he should be eating to decrease inflammation and be the healthiest he can be.  Pt states his wife is very supportive and willing to try anything.  Pt states his appetite but sometimes does get caught up in being busy leading to not eating for 6 or more hours.   24 hr recall: 2-3 salads a week Smoothie: low fat yogurt, berries, walnuts  Wings and Salad or sugar free bread sandwich with apple sometimes sugar free cookies and half and half tea   Chicken Brussels sprouts and applesauce or broccoli  Drinks: half and half tea, gatorade zero  Goals: Split your dose of calcium Aim for 80 ounces of fluid with 40 ounces of plain water  Be sure to have at least 2 complex carbohydrate choices per meal Only have beef/pork once a week Have 2 meals a week which are plant based Try soy crumbles in the frozen food aisle  For bread: whole wheat flour and no high fructose corn syrup  Talk with your doctor about your metformin in relation to your CKD stage 3

## 2018-05-24 ENCOUNTER — Other Ambulatory Visit: Payer: Self-pay | Admitting: Radiation Oncology

## 2018-05-30 ENCOUNTER — Other Ambulatory Visit: Payer: Self-pay | Admitting: Radiation Oncology

## 2018-06-12 DIAGNOSIS — R972 Elevated prostate specific antigen [PSA]: Secondary | ICD-10-CM | POA: Diagnosis not present

## 2018-06-21 ENCOUNTER — Encounter: Payer: Medicare Other | Attending: Physician Assistant | Admitting: Skilled Nursing Facility1

## 2018-06-21 DIAGNOSIS — Z8546 Personal history of malignant neoplasm of prostate: Secondary | ICD-10-CM | POA: Insufficient documentation

## 2018-06-21 DIAGNOSIS — G40909 Epilepsy, unspecified, not intractable, without status epilepticus: Secondary | ICD-10-CM | POA: Diagnosis not present

## 2018-06-21 DIAGNOSIS — Z7982 Long term (current) use of aspirin: Secondary | ICD-10-CM | POA: Diagnosis not present

## 2018-06-21 DIAGNOSIS — N183 Chronic kidney disease, stage 3 (moderate): Secondary | ICD-10-CM | POA: Diagnosis present

## 2018-06-21 DIAGNOSIS — E1122 Type 2 diabetes mellitus with diabetic chronic kidney disease: Secondary | ICD-10-CM | POA: Diagnosis not present

## 2018-06-21 DIAGNOSIS — M069 Rheumatoid arthritis, unspecified: Secondary | ICD-10-CM | POA: Insufficient documentation

## 2018-06-21 DIAGNOSIS — Z79899 Other long term (current) drug therapy: Secondary | ICD-10-CM | POA: Insufficient documentation

## 2018-06-21 DIAGNOSIS — E119 Type 2 diabetes mellitus without complications: Secondary | ICD-10-CM

## 2018-06-21 DIAGNOSIS — Z713 Dietary counseling and surveillance: Secondary | ICD-10-CM | POA: Insufficient documentation

## 2018-06-21 DIAGNOSIS — Z7984 Long term (current) use of oral hypoglycemic drugs: Secondary | ICD-10-CM | POA: Insufficient documentation

## 2018-06-21 NOTE — Progress Notes (Signed)
   Pt arrives stating he has lost about 4 pounds due to dealing with his parents estate. Pt states he and his wife are working on getting a Market researcher. Pt states he does not care about candy so it is not a candy issue. Pt states his chiropractor advised he take 500 dollars worth of supplements stating that is too much money. Pt states he wants to get his swimming up to 60 minutes stating his frozen shoulder is getting better. Pt states he has less neuropathy flare ups than he used to due to the electro therapy. Pt states he has been working on drinking more water.   24 hr recall: 2-3 salads a week Smoothie: low fat yogurt, berries, walnuts  Wings and Salad or sugar free bread sandwich with apple sometimes sugar free cookies and half and half tea   Chicken Brussels sprouts and applesauce or broccoli  Drinks: half and half tea, gatorade zero  Goals:  Take a break with your workouts giving your body rest days at least once a week Eat different types of foods often throughout the week When you are not having an appetite make a smoothie

## 2019-05-23 ENCOUNTER — Other Ambulatory Visit: Payer: Self-pay | Admitting: Adult Health

## 2019-05-23 NOTE — Progress Notes (Signed)
  I connected by phone with [name] on 05/23/2019 at 4:04 PM to discuss the potential use of an new treatment for mild to moderate COVID-19 viral infection in non-hospitalized patients.  This patient is a 69 y.o. male that meets the FDA criteria for Emergency Use Authorization of bamlanivimab or casirivimab\imdevimab.  Has a (+) direct SARS-CoV-2 viral test result  Has mild or moderate COVID-19   Is ? 69 years of age and weighs ? 40 kg  Is NOT hospitalized due to COVID-19  Is NOT requiring oxygen therapy or requiring an increase in baseline oxygen flow rate due to COVID-19  Is within 10 days of symptom onset  Has at least one of the high risk factor(s) for progression to severe COVID-19 and/or hospitalization as defined in EUA.  Specific high risk criteria : >/= 68 yo   I have spoken and communicated the following to the patient or parent/caregiver:  To bring copy of test .   1. FDA has authorized the emergency use of bamlanivimab and casirivimab\imdevimab for the treatment of mild to moderate COVID-19 in adults and pediatric patients with positive results of direct SARS-CoV-2 viral testing who are 73 years of age and older weighing at least 40 kg, and who are at high risk for progressing to severe COVID-19 and/or hospitalization.  2. The significant known and potential risks and benefits of bamlanivimab and casirivimab\imdevimab, and the extent to which such potential risks and benefits are unknown.  3. Information on available alternative treatments and the risks and benefits of those alternatives, including clinical trials.  4. Patients treated with bamlanivimab and casirivimab\imdevimab should continue to self-isolate and use infection control measures (e.g., wear mask, isolate, social distance, avoid sharing personal items, clean and disinfect "high touch" surfaces, and frequent handwashing) according to CDC guidelines.   5. The patient or parent/caregiver has the option  to accept or refuse bamlanivimab or casirivimab\imdevimab .  After reviewing this information with the patient, The patient agreed to proceed with receiving the bamlanimivab infusion and will be provided a copy of the Fact sheet prior to receiving the infusion..  Scheduled for 05/24/19 at [place] 05/23/2019 4:04 PM

## 2019-05-24 ENCOUNTER — Encounter (HOSPITAL_COMMUNITY): Payer: Self-pay

## 2019-05-24 ENCOUNTER — Ambulatory Visit (HOSPITAL_COMMUNITY)
Admission: RE | Admit: 2019-05-24 | Discharge: 2019-05-24 | Disposition: A | Discharge status: Home / Self Care | Payer: Medicare Other | Source: Ambulatory Visit | Attending: Pulmonary Disease | Admitting: Pulmonary Disease

## 2019-05-24 DIAGNOSIS — Z23 Encounter for immunization: Secondary | ICD-10-CM | POA: Diagnosis not present

## 2019-05-24 DIAGNOSIS — U071 COVID-19: Secondary | ICD-10-CM | POA: Diagnosis not present

## 2019-05-24 MED ORDER — FAMOTIDINE IN NACL 20-0.9 MG/50ML-% IV SOLN: 20.0000 mg | Freq: Once | INTRAVENOUS | Status: DC | PRN

## 2019-05-24 MED ORDER — METHYLPREDNISOLONE SODIUM SUCC 125 MG IJ SOLR: 125.0000 mg | Freq: Once | INTRAMUSCULAR | Status: DC | PRN

## 2019-05-24 MED ORDER — DIPHENHYDRAMINE HCL 50 MG/ML IJ SOLN: 50.0000 mg | Freq: Once | INTRAMUSCULAR | Status: DC | PRN

## 2019-05-24 MED ORDER — EPINEPHRINE 0.3 MG/0.3ML IJ SOAJ: 0.3000 mg | Freq: Once | INTRAMUSCULAR | Status: DC | PRN

## 2019-05-24 MED ORDER — SODIUM CHLORIDE 0.9 % IV SOLN: INTRAVENOUS | Status: DC | PRN

## 2019-05-24 MED ORDER — SODIUM CHLORIDE 0.9 % IV SOLN
700.0000 mg | Freq: Once | INTRAVENOUS | Status: AC
  Administered 2019-05-24: 700 mg via INTRAVENOUS
  Filled 2019-05-24: qty 20

## 2019-05-24 MED ORDER — ALBUTEROL SULFATE HFA 108 (90 BASE) MCG/ACT IN AERS: 2.0000 | INHALATION_SPRAY | Freq: Once | RESPIRATORY_TRACT | Status: DC | PRN

## 2019-05-24 NOTE — Progress Notes (Signed)
  Diagnosis: COVID-19  Physician:dr wright  Procedure: [facility] Med: bamlanivimab infusion - Provided patient with bamlanimivab fact sheet for patients, parents and caregivers prior to infusion.  Complications: No immediate complications noted.  Discharge: Discharged home   [place] 05/24/2019

## 2019-05-24 NOTE — Discharge Instructions (Signed)
COVID-19 COVID-19 is a respiratory infection that is caused by a virus called severe acute respiratory syndrome coronavirus 2 (SARS-CoV-2). The disease is also known as coronavirus disease or novel coronavirus. In some people, the virus may not cause any symptoms. In others, it may cause a serious infection. The infection can get worse quickly and can lead to complications, such as:  Pneumonia, or infection of the lungs.  Acute respiratory distress syndrome or ARDS. This is a condition in which fluid build-up in the lungs prevents the lungs from filling with air and passing oxygen into the blood.  Acute respiratory failure. This is a condition in which there is not enough oxygen passing from the lungs to the body or when carbon dioxide is not passing from the lungs out of the body.  Sepsis or septic shock. This is a serious bodily reaction to an infection.  Blood clotting problems.  Secondary infections due to bacteria or fungus.  Organ failure. This is when your body's organs stop working. The virus that causes COVID-19 is contagious. This means that it can spread from person to person through droplets from coughs and sneezes (respiratory secretions). What are the causes? This illness is caused by a virus. You may catch the virus by:  Breathing in droplets from an infected person. Droplets can be spread by a person breathing, speaking, singing, coughing, or sneezing.  Touching something, like a table or a doorknob, that was exposed to the virus (contaminated) and then touching your mouth, nose, or eyes. What increases the risk? Risk for infection You are more likely to be infected with this virus if you:  Are within 6 feet (2 meters) of a person with COVID-19.  Provide care for or live with a person who is infected with COVID-19.  Spend time in crowded indoor spaces or live in shared housing. Risk for serious illness You are more likely to become seriously ill from the virus if  you:  Are 50 years of age or older. The higher your age, the more you are at risk for serious illness.  Live in a nursing home or long-term care facility.  Have cancer.  Have a long-term (chronic) disease such as: ? Chronic lung disease, including chronic obstructive pulmonary disease or asthma. ? A long-term disease that lowers your body's ability to fight infection (immunocompromised). ? Heart disease, including heart failure, a condition in which the arteries that lead to the heart become narrow or blocked (coronary artery disease), a disease which makes the heart muscle thick, weak, or stiff (cardiomyopathy). ? Diabetes. ? Chronic kidney disease. ? Sickle cell disease, a condition in which red blood cells have an abnormal "sickle" shape. ? Liver disease.  Are obese. What are the signs or symptoms? Symptoms of this condition can range from mild to severe. Symptoms may appear any time from 2 to 14 days after being exposed to the virus. They include:  A fever or chills.  A cough.  Difficulty breathing.  Headaches, body aches, or muscle aches.  Runny or stuffy (congested) nose.  A sore throat.  New loss of taste or smell. Some people may also have stomach problems, such as nausea, vomiting, or diarrhea. Other people may not have any symptoms of COVID-19. How is this diagnosed? This condition may be diagnosed based on:  Your signs and symptoms, especially if: ? You live in an area with a COVID-19 outbreak. ? You recently traveled to or from an area where the virus is common. ? You   provide care for or live with a person who was diagnosed with COVID-19. ? You were exposed to a person who was diagnosed with COVID-19.  A physical exam.  Lab tests, which may include: ? Taking a sample of fluid from the back of your nose and throat (nasopharyngeal fluid), your nose, or your throat using a swab. ? A sample of mucus from your lungs (sputum). ? Blood tests.  Imaging tests,  which may include, X-rays, CT scan, or ultrasound. How is this treated? At present, there is no medicine to treat COVID-19. Medicines that treat other diseases are being used on a trial basis to see if they are effective against COVID-19. Your health care provider will talk with you about ways to treat your symptoms. For most people, the infection is mild and can be managed at home with rest, fluids, and over-the-counter medicines. Treatment for a serious infection usually takes places in a hospital intensive care unit (ICU). It may include one or more of the following treatments. These treatments are given until your symptoms improve.  Receiving fluids and medicines through an IV.  Supplemental oxygen. Extra oxygen is given through a tube in the nose, a face mask, or a hood.  Positioning you to lie on your stomach (prone position). This makes it easier for oxygen to get into the lungs.  Continuous positive airway pressure (CPAP) or bi-level positive airway pressure (BPAP) machine. This treatment uses mild air pressure to keep the airways open. A tube that is connected to a motor delivers oxygen to the body.  Ventilator. This treatment moves air into and out of the lungs by using a tube that is placed in your windpipe.  Tracheostomy. This is a procedure to create a hole in the neck so that a breathing tube can be inserted.  Extracorporeal membrane oxygenation (ECMO). This procedure gives the lungs a chance to recover by taking over the functions of the heart and lungs. It supplies oxygen to the body and removes carbon dioxide. Follow these instructions at home: Lifestyle  If you are sick, stay home except to get medical care. Your health care provider will tell you how long to stay home. Call your health care provider before you go for medical care.  Rest at home as told by your health care provider.  Do not use any products that contain nicotine or tobacco, such as cigarettes,  e-cigarettes, and chewing tobacco. If you need help quitting, ask your health care provider.  Return to your normal activities as told by your health care provider. Ask your health care provider what activities are safe for you. General instructions  Take over-the-counter and prescription medicines only as told by your health care provider.  Drink enough fluid to keep your urine pale yellow.  Keep all follow-up visits as told by your health care provider. This is important. How is this prevented?  There is no vaccine to help prevent COVID-19 infection. However, there are steps you can take to protect yourself and others from this virus. To protect yourself:   Do not travel to areas where COVID-19 is a risk. The areas where COVID-19 is reported change often. To identify high-risk areas and travel restrictions, check the CDC travel website: wwwnc.cdc.gov/travel/notices  If you live in, or must travel to, an area where COVID-19 is a risk, take precautions to avoid infection. ? Stay away from people who are sick. ? Wash your hands often with soap and water for 20 seconds. If soap and water   are not available, use an alcohol-based hand sanitizer. ? Avoid touching your mouth, face, eyes, or nose. ? Avoid going out in public, follow guidance from your state and local health authorities. ? If you must go out in public, wear a cloth face covering or face mask. Make sure your mask covers your nose and mouth. ? Avoid crowded indoor spaces. Stay at least 6 feet (2 meters) away from others. ? Disinfect objects and surfaces that are frequently touched every day. This may include:  Counters and tables.  Doorknobs and light switches.  Sinks and faucets.  Electronics, such as phones, remote controls, keyboards, computers, and tablets. To protect others: If you have symptoms of COVID-19, take steps to prevent the virus from spreading to others.  If you think you have a COVID-19 infection, contact  your health care provider right away. Tell your health care team that you think you may have a COVID-19 infection.  Stay home. Leave your house only to seek medical care. Do not use public transport.  Do not travel while you are sick.  Wash your hands often with soap and water for 20 seconds. If soap and water are not available, use alcohol-based hand sanitizer.  Stay away from other members of your household. Let healthy household members care for children and pets, if possible. If you have to care for children or pets, wash your hands often and wear a mask. If possible, stay in your own room, separate from others. Use a different bathroom.  Make sure that all people in your household wash their hands well and often.  Cough or sneeze into a tissue or your sleeve or elbow. Do not cough or sneeze into your hand or into the air.  Wear a cloth face covering or face mask. Make sure your mask covers your nose and mouth. Where to find more information  Centers for Disease Control and Prevention: www.cdc.gov/coronavirus/2019-ncov/index.html  World Health Organization: www.who.int/health-topics/coronavirus Contact a health care provider if:  You live in or have traveled to an area where COVID-19 is a risk and you have symptoms of the infection.  You have had contact with someone who has COVID-19 and you have symptoms of the infection. Get help right away if:  You have trouble breathing.  You have pain or pressure in your chest.  You have confusion.  You have bluish lips and fingernails.  You have difficulty waking from sleep.  You have symptoms that get worse. These symptoms may represent a serious problem that is an emergency. Do not wait to see if the symptoms will go away. Get medical help right away. Call your local emergency services (911 in the [place] Do not drive yourself to the hospital. Let the emergency medical personnel know if you think you have  COVID-19. Summary  COVID-19 is a respiratory infection that is caused by a virus. It is also known as coronavirus disease or novel coronavirus. It can cause serious infections, such as pneumonia, acute respiratory distress syndrome, acute respiratory failure, or sepsis.  The virus that causes COVID-19 is contagious. This means that it can spread from person to person through droplets from breathing, speaking, singing, coughing, or sneezing.  You are more likely to develop a serious illness if you are 50 years of age or older, have a weak immune system, live in a nursing home, or have chronic disease.  There is no medicine to treat COVID-19. Your health care provider will talk with you about ways to treat your symptoms.    Take steps to protect yourself and others from infection. Wash your hands often and disinfect objects and surfaces that are frequently touched every day. Stay away from people who are sick and wear a mask if you are sick. This information is not intended to replace advice given to you by your health care provider. Make sure you discuss any questions you have with your health care provider. Document Revised: 02/02/2019 Document Reviewed: 05/11/2018 Elsevier Patient Education  2020 [employer] What types of side effects do monoclonal antibody drugs cause?  Common side effects  In general, the more common side effects caused by monoclonal antibody drugs include: . Allergic reactions, such as hives or itching . Flu-like signs and symptoms, including chills, fatigue, fever, and muscle aches and pains . Nausea, vomiting . Diarrhea . Skin rashes . Low blood pressure   The CDC is recommending patients who receive monoclonal antibody treatments wait at least 90 days before being vaccinated.  Currently, there are no data on the safety and efficacy of mRNA COVID-19 vaccines in persons who received monoclonal antibodies or convalescent plasma as part of COVID-19 treatment. Based  on the estimated half-life of such therapies as well as evidence suggesting that reinfection is uncommon in the 90 days after initial infection, vaccination should be deferred for at least 90 days, as a precautionary measure until additional information becomes available, to avoid interference of the antibody treatment with vaccine-induced immune responses. 

## 2019-05-28 ENCOUNTER — Ambulatory Visit: Payer: Medicare Other | Attending: Internal Medicine

## 2019-07-26 ENCOUNTER — Other Ambulatory Visit (HOSPITAL_BASED_OUTPATIENT_CLINIC_OR_DEPARTMENT_OTHER): Payer: Self-pay | Admitting: Nephrology

## 2019-07-26 DIAGNOSIS — N1831 Chronic kidney disease, stage 3a: Secondary | ICD-10-CM

## 2019-07-30 ENCOUNTER — Other Ambulatory Visit: Payer: Self-pay

## 2019-07-30 ENCOUNTER — Ambulatory Visit (HOSPITAL_BASED_OUTPATIENT_CLINIC_OR_DEPARTMENT_OTHER)
Admission: RE | Admit: 2019-07-30 | Discharge: 2019-07-30 | Disposition: A | Discharge status: Home / Self Care | Payer: Medicare PPO | Source: Ambulatory Visit | Attending: Nephrology | Admitting: Nephrology

## 2019-07-30 DIAGNOSIS — N1831 Chronic kidney disease, stage 3a: Secondary | ICD-10-CM | POA: Insufficient documentation

## 2020-04-24 ENCOUNTER — Other Ambulatory Visit: Payer: Self-pay | Admitting: Physician Assistant

## 2020-04-24 DIAGNOSIS — R1909 Other intra-abdominal and pelvic swelling, mass and lump: Secondary | ICD-10-CM

## 2020-04-25 ENCOUNTER — Ambulatory Visit
Admission: RE | Admit: 2020-04-25 | Discharge: 2020-04-25 | Disposition: A | Discharge status: Home / Self Care | Payer: Medicare PPO | Source: Ambulatory Visit | Attending: Physician Assistant | Admitting: Physician Assistant

## 2020-04-25 DIAGNOSIS — R1909 Other intra-abdominal and pelvic swelling, mass and lump: Secondary | ICD-10-CM

## 2020-07-04 IMAGING — US US RENAL
1 series · 13 of 25 positions shown · non-contrast
Comparison: None.

CLINICAL DATA: Stage III chronic renal disease

EXAM:
RENAL / URINARY TRACT ULTRASOUND COMPLETE

[Series 1: us renal · 13 of 33 slices shown]
[im 1/33]
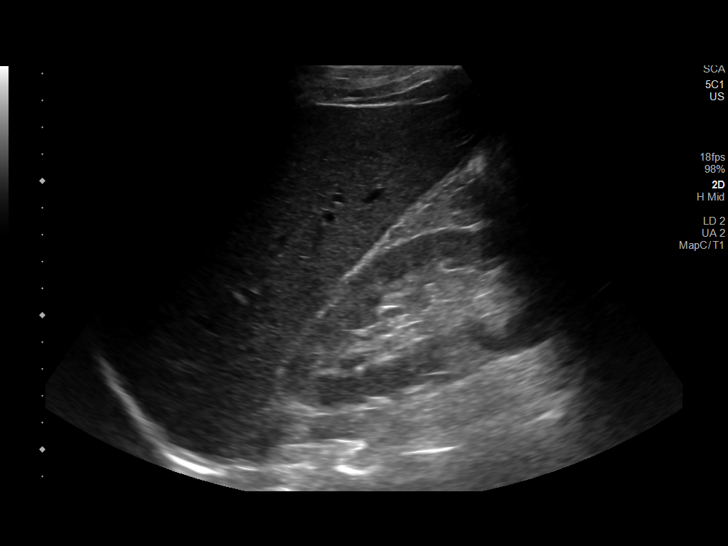
[im 3/33]
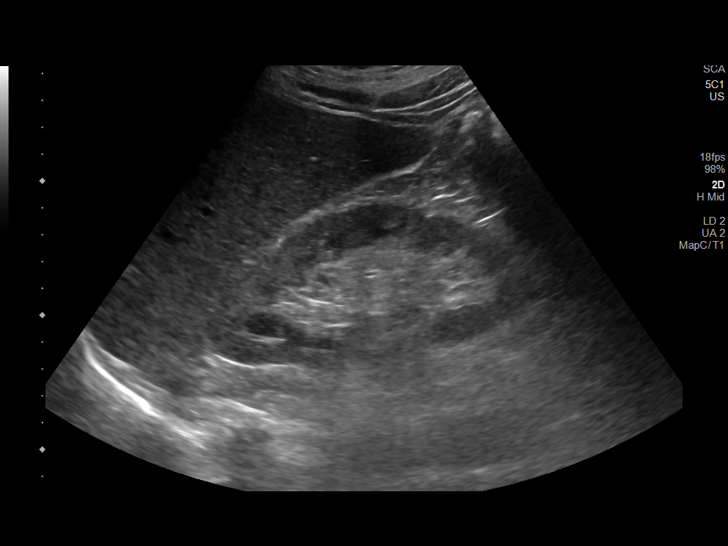
[im 6/33]
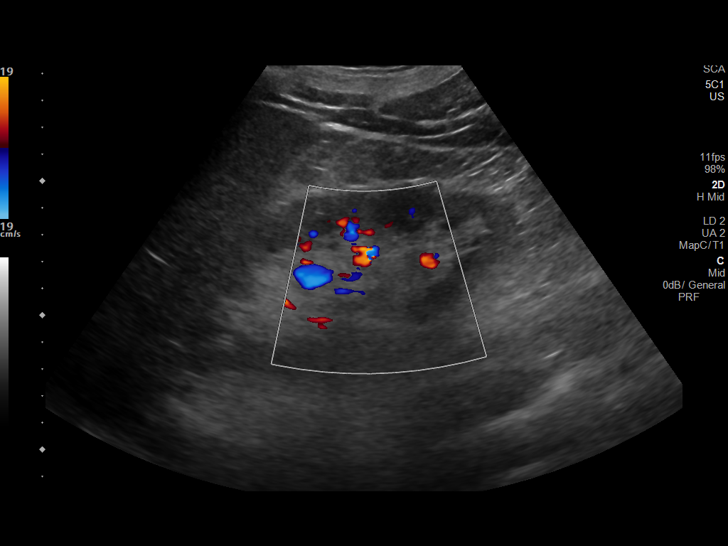
[im 9/33]
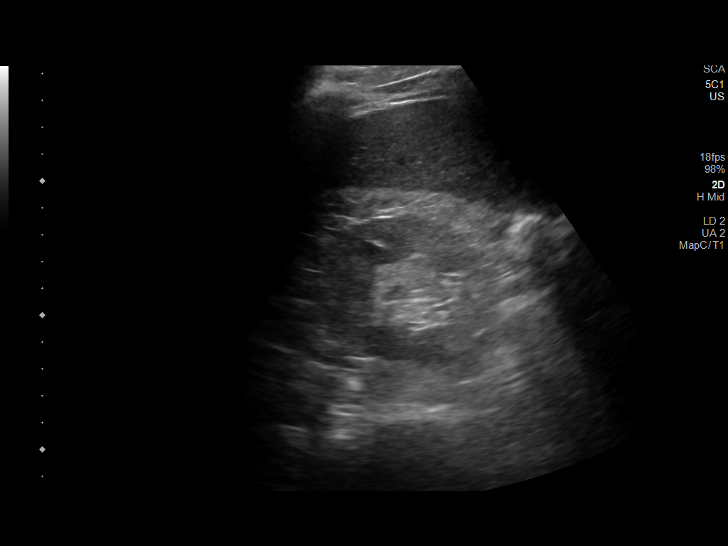
[im 11/33]
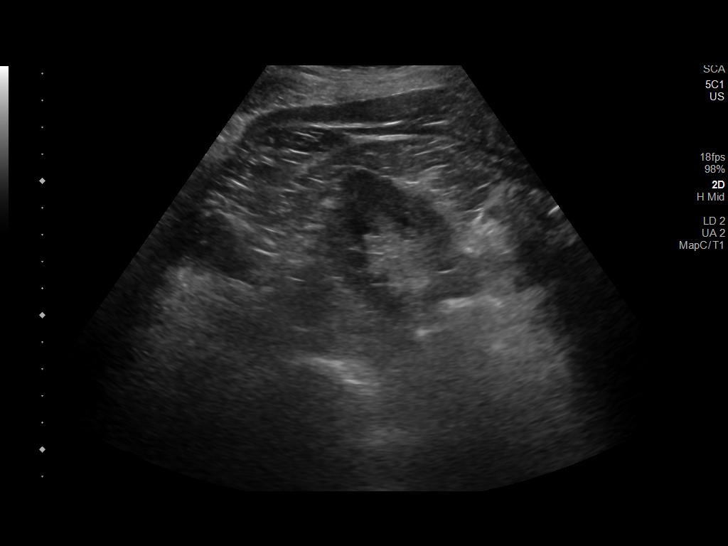
[im 14/33]
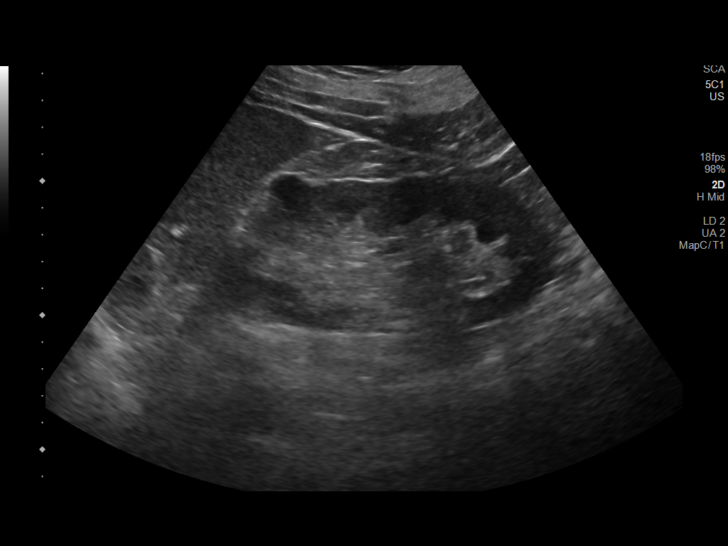
[im 17/33]
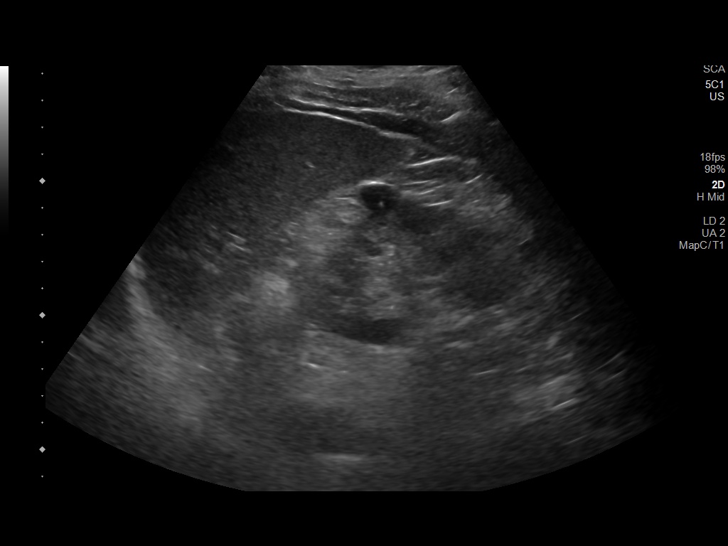
[im 19/33]
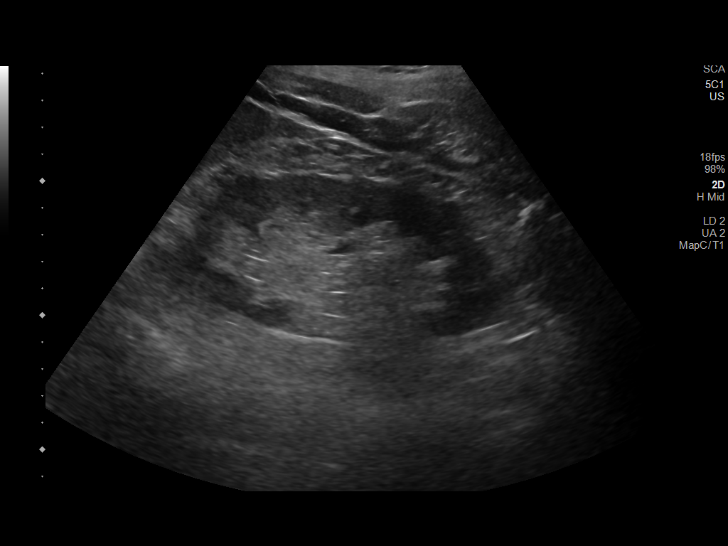
[im 22/33]
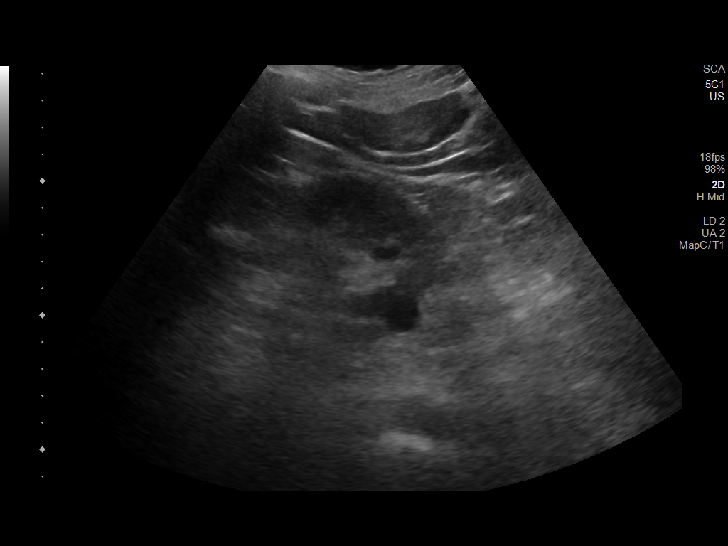
[im 25/33]
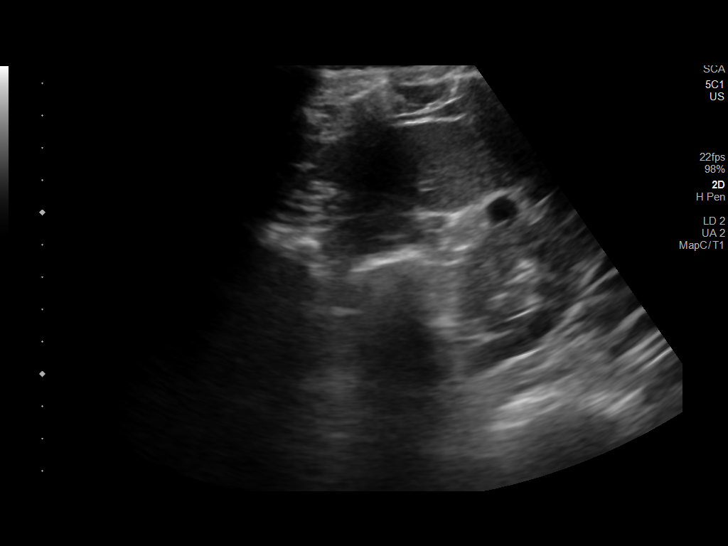
[im 27/33]
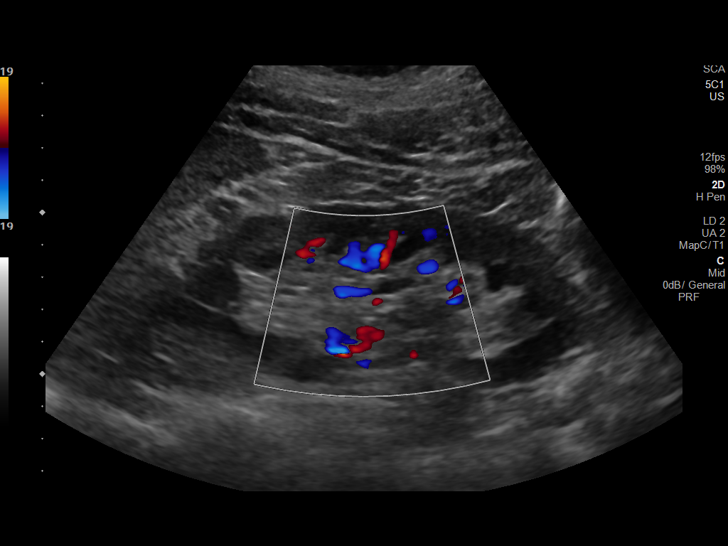
[im 30/33]
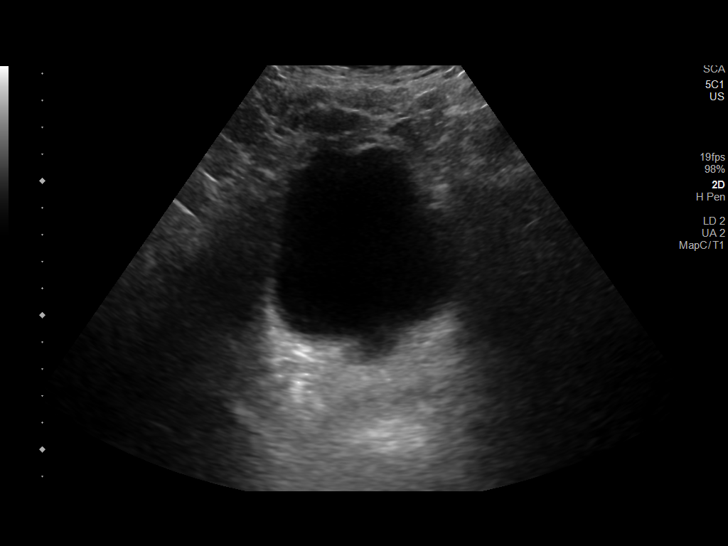
[im 33/33]
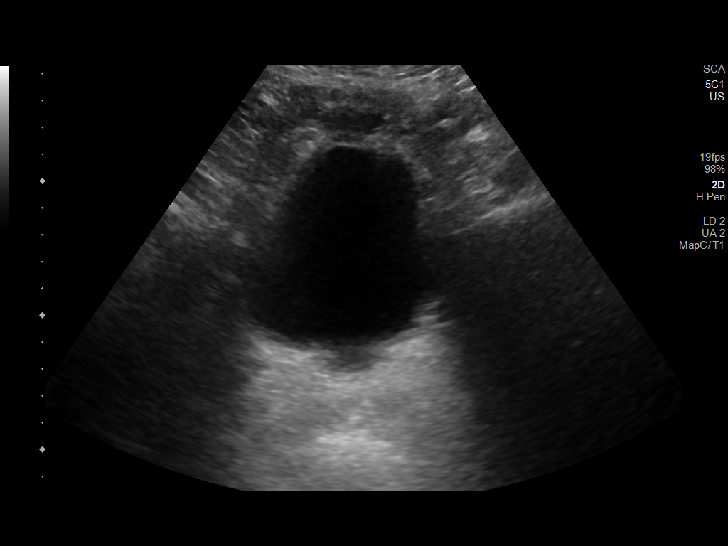

[13 of 25 positions shown; findings below may reference images not displayed]

FINDINGS: Right Kidney:

Renal measurements: 14.1 x 5.3 x 5.2 cm = volume: 200.9 mL .
Echogenicity and renal cortical thickness are within normal limits.
No mass, perinephric fluid, or hydronephrosis visualized. No
sonographically demonstrable calculus or ureterectasis.

Left Kidney:

Renal measurements: 13.0 x 6.0 x 4.8 cm = volume: 199.0 mL.
Echogenicity and renal cortical thickness are within normal limits.
No perinephric fluid or hydronephrosis visualized. There is a cyst
arising from the lower pole left kidney measuring 0.7 x 1.5 x
cm. A cyst in the mid left kidney measures 1.7 x 1.1 x 1.1 cm. No
sonographically demonstrable calculus or ureterectasis.

Bladder:

There is no urinary bladder wall thickening. There is a questionable
small diverticulum arising from the inferior aspect of the bladder
versus a previous transurethral prostatic resection defect.

Other:

None.
IMPRESSION: 1. Small cyst left kidney. Kidneys otherwise appear normal. No
obstructing focus in either kidney. Renal cortical thickness and
renal echogenicity normal bilaterally.

2. Probable postoperative transurethral prostatic resection defect
along inferior aspect of urinary bladder. Urinary bladder otherwise
appears normal.

## 2020-11-17 ENCOUNTER — Ambulatory Visit (HOSPITAL_COMMUNITY)
Admission: AD | Admit: 2020-11-17 | Discharge: 2020-11-17 | Disposition: A | Discharge status: Home / Self Care | Payer: Medicare PPO | Source: Ambulatory Visit | Attending: Urology | Admitting: Urology

## 2020-11-17 ENCOUNTER — Inpatient Hospital Stay (HOSPITAL_COMMUNITY): Payer: Medicare PPO | Admitting: Anesthesiology

## 2020-11-17 ENCOUNTER — Other Ambulatory Visit: Payer: Self-pay | Admitting: Urology

## 2020-11-17 ENCOUNTER — Encounter (HOSPITAL_COMMUNITY)
Admission: AD | Disposition: A | Discharge status: Home / Self Care | Payer: Self-pay | Source: Ambulatory Visit | Attending: Urology

## 2020-11-17 ENCOUNTER — Inpatient Hospital Stay (HOSPITAL_COMMUNITY): Payer: Medicare PPO

## 2020-11-17 ENCOUNTER — Encounter (HOSPITAL_COMMUNITY): Payer: Self-pay | Admitting: Urology

## 2020-11-17 DIAGNOSIS — E7800 Pure hypercholesterolemia, unspecified: Secondary | ICD-10-CM | POA: Diagnosis not present

## 2020-11-17 DIAGNOSIS — R339 Retention of urine, unspecified: Secondary | ICD-10-CM | POA: Diagnosis not present

## 2020-11-17 DIAGNOSIS — N35919 Unspecified urethral stricture, male, unspecified site: Secondary | ICD-10-CM | POA: Diagnosis not present

## 2020-11-17 DIAGNOSIS — Z8051 Family history of malignant neoplasm of kidney: Secondary | ICD-10-CM | POA: Diagnosis not present

## 2020-11-17 DIAGNOSIS — Z20822 Contact with and (suspected) exposure to covid-19: Secondary | ICD-10-CM | POA: Insufficient documentation

## 2020-11-17 DIAGNOSIS — G40909 Epilepsy, unspecified, not intractable, without status epilepticus: Secondary | ICD-10-CM | POA: Diagnosis not present

## 2020-11-17 DIAGNOSIS — Z85828 Personal history of other malignant neoplasm of skin: Secondary | ICD-10-CM | POA: Diagnosis not present

## 2020-11-17 DIAGNOSIS — E119 Type 2 diabetes mellitus without complications: Secondary | ICD-10-CM | POA: Diagnosis not present

## 2020-11-17 DIAGNOSIS — Z8042 Family history of malignant neoplasm of prostate: Secondary | ICD-10-CM | POA: Diagnosis not present

## 2020-11-17 HISTORY — DX: Plantar fascial fibromatosis: M72.2

## 2020-11-17 HISTORY — DX: Family history of other specified conditions: Z84.89

## 2020-11-17 HISTORY — DX: Retention of urine, unspecified: R33.9

## 2020-11-17 HISTORY — DX: Chronic kidney disease, unspecified: N18.9

## 2020-11-17 HISTORY — PX: CYSTOSCOPY: SHX5120

## 2020-11-17 LAB — CBC
HCT: 44.5 % (ref 39.0–52.0)
Hemoglobin: 14.8 g/dL (ref 13.0–17.0)
MCH: 33 pg (ref 26.0–34.0)
MCHC: 33.3 g/dL (ref 30.0–36.0)
MCV: 99.3 fL (ref 80.0–100.0)
Platelets: 208 10*3/uL (ref 150–400)
RBC: 4.48 MIL/uL (ref 4.22–5.81)
RDW: 11.7 % (ref 11.5–15.5)
WBC: 9.3 10*3/uL (ref 4.0–10.5)
nRBC: 0 % (ref 0.0–0.2)

## 2020-11-17 LAB — BASIC METABOLIC PANEL
Anion gap: 8 (ref 5–15)
BUN: 26 mg/dL — ABNORMAL HIGH (ref 8–23)
CO2: 23 mmol/L (ref 22–32)
Calcium: 9.8 mg/dL (ref 8.9–10.3)
Chloride: 105 mmol/L (ref 98–111)
Creatinine, Ser: 2.13 mg/dL — ABNORMAL HIGH (ref 0.61–1.24)
GFR, Estimated: 33 mL/min — ABNORMAL LOW (ref >60)
Glucose, Bld: 119 mg/dL — ABNORMAL HIGH (ref 70–99)
Potassium: 4.3 mmol/L (ref 3.5–5.1)
Sodium: 136 mmol/L (ref 135–145)

## 2020-11-17 LAB — GLUCOSE, CAPILLARY: Glucose-Capillary: 118 mg/dL — ABNORMAL HIGH (ref 70–99)

## 2020-11-17 LAB — SARS CORONAVIRUS 2 BY RT PCR (HOSPITAL ORDER, PERFORMED IN ~~LOC~~ HOSPITAL LAB): SARS Coronavirus 2: NEGATIVE

## 2020-11-17 SURGERY — CYSTOSCOPY
Anesthesia: General | Site: Urethra

## 2020-11-17 MED ORDER — STERILE WATER FOR IRRIGATION IR SOLN
Status: DC | PRN
  Administered 2020-11-17: 3000 mL

## 2020-11-17 MED ORDER — MIDAZOLAM HCL 2 MG/2ML IJ SOLN
INTRAMUSCULAR | Status: AC
  Filled 2020-11-17: qty 2

## 2020-11-17 MED ORDER — DEXAMETHASONE SODIUM PHOSPHATE 10 MG/ML IJ SOLN
INTRAMUSCULAR | Status: DC | PRN
  Administered 2020-11-17: 5 mg via INTRAVENOUS

## 2020-11-17 MED ORDER — FENTANYL CITRATE (PF) 250 MCG/5ML IJ SOLN
INTRAMUSCULAR | Status: DC | PRN
  Administered 2020-11-17 (×2): 50 ug via INTRAVENOUS

## 2020-11-17 MED ORDER — GLYCOPYRROLATE PF 0.2 MG/ML IJ SOSY
PREFILLED_SYRINGE | INTRAMUSCULAR | Status: AC
  Filled 2020-11-17: qty 1

## 2020-11-17 MED ORDER — CHLORHEXIDINE GLUCONATE 0.12 % MT SOLN
15.0000 mL | Freq: Once | OROMUCOSAL | Status: AC
  Administered 2020-11-17: 15 mL via OROMUCOSAL

## 2020-11-17 MED ORDER — LACTATED RINGERS IV SOLN: INTRAVENOUS | Status: DC

## 2020-11-17 MED ORDER — LIDOCAINE 2% (20 MG/ML) 5 ML SYRINGE
INTRAMUSCULAR | Status: DC | PRN
  Administered 2020-11-17: 80 mg via INTRAVENOUS

## 2020-11-17 MED ORDER — CEFAZOLIN SODIUM-DEXTROSE 2-4 GM/100ML-% IV SOLN
2.0000 g | Freq: Once | INTRAVENOUS | Status: AC
  Administered 2020-11-17: 2 g via INTRAVENOUS
  Filled 2020-11-17 (×2): qty 100

## 2020-11-17 MED ORDER — MIDAZOLAM HCL 5 MG/5ML IJ SOLN
INTRAMUSCULAR | Status: DC | PRN
  Administered 2020-11-17: 2 mg via INTRAVENOUS

## 2020-11-17 MED ORDER — PROPOFOL 10 MG/ML IV BOLUS
INTRAVENOUS | Status: AC
  Filled 2020-11-17: qty 20

## 2020-11-17 MED ORDER — FENTANYL CITRATE (PF) 100 MCG/2ML IJ SOLN
INTRAMUSCULAR | Status: AC
  Filled 2020-11-17: qty 2

## 2020-11-17 MED ORDER — SODIUM CHLORIDE 0.9 % IV SOLN: 2.0000 g | INTRAVENOUS | Status: DC

## 2020-11-17 MED ORDER — SUCCINYLCHOLINE CHLORIDE 200 MG/10ML IV SOSY
PREFILLED_SYRINGE | INTRAVENOUS | Status: DC | PRN
  Administered 2020-11-17: 120 mg via INTRAVENOUS

## 2020-11-17 MED ORDER — PROPOFOL 10 MG/ML IV BOLUS
INTRAVENOUS | Status: DC | PRN
  Administered 2020-11-17: 160 mg via INTRAVENOUS

## 2020-11-17 MED ORDER — ONDANSETRON HCL 4 MG/2ML IJ SOLN
INTRAMUSCULAR | Status: DC | PRN
  Administered 2020-11-17: 4 mg via INTRAVENOUS

## 2020-11-17 SURGICAL SUPPLY — 16 items
BAG DRN RND TRDRP ANRFLXCHMBR (UROLOGICAL SUPPLIES) ×1
BAG URINE DRAIN 2000ML AR STRL (UROLOGICAL SUPPLIES) ×2 IMPLANT
BAG URO CATCHER STRL LF (MISCELLANEOUS) ×2 IMPLANT
BALLN NEPHROSTOMY (BALLOONS) ×2
BALLOON NEPHROSTOMY (BALLOONS) ×1 IMPLANT
CATH FOLEY 2W COUNCIL 5CC 18FR (CATHETERS) ×2 IMPLANT
CATH URET 5FR 28IN OPEN ENDED (CATHETERS) IMPLANT
CLOTH BEACON ORANGE TIMEOUT ST (SAFETY) ×2 IMPLANT
GLOVE SURG POLYISO LF SZ8 (GLOVE) ×2 IMPLANT
GOWN STRL REUS W/TWL XL LVL3 (GOWN DISPOSABLE) ×2 IMPLANT
GUIDEWIRE STR DUAL SENSOR (WIRE) ×2 IMPLANT
KIT TURNOVER KIT A (KITS) ×2 IMPLANT
MANIFOLD NEPTUNE II (INSTRUMENTS) ×2 IMPLANT
PACK CYSTO (CUSTOM PROCEDURE TRAY) ×2 IMPLANT
TUBING CONNECTING 10 (TUBING) ×2 IMPLANT
TUBING UROLOGY SET (TUBING) ×1 IMPLANT

## 2020-11-17 NOTE — Transfer of Care (Signed)
Immediate Anesthesia Transfer of Care Note  Patient: [name]  Procedure(s) Performed: CYSTOSCOPY, URETHRAL DILATATION,FOLEY CATH PLACEMENT,BALLOON DILATATION, URETEROSCOPY (Urethra)  Patient Location: PACU  Anesthesia Type:General  Level of Consciousness: awake, alert , oriented and patient cooperative  Airway & Oxygen Therapy: Patient Spontanous Breathing and Patient connected to face mask oxygen  Post-op Assessment: Report given to RN and Post -op Vital signs reviewed and stable  Post vital signs: Reviewed and stable  Last Vitals:  Vitals Value Taken Time  BP 135/82 11/17/20 1948  Temp    Pulse 69 11/17/20 1952  Resp 12 11/17/20 1952  SpO2 100 % 11/17/20 1952  Vitals shown include unvalidated device data.  Last Pain:  Vitals:   11/17/20 1751  TempSrc: Oral  PainSc: 0-No pain         Complications: No notable events documented.

## 2020-11-17 NOTE — Anesthesia Postprocedure Evaluation (Signed)
Anesthesia Post Note  Patient: [name]  Procedure(s) Performed: CYSTOSCOPY, URETHRAL DILATATION,FOLEY CATH PLACEMENT,BALLOON DILATATION, URETEROSCOPY (Urethra)     Patient location during evaluation: PACU Anesthesia Type: General Level of consciousness: sedated and patient cooperative Pain management: pain level controlled Vital Signs Assessment: post-procedure vital signs reviewed and stable Respiratory status: spontaneous breathing Cardiovascular status: stable Anesthetic complications: no   No notable events documented.  Last Vitals:  Vitals:   11/17/20 2030 11/17/20 2044  BP: 130/83 131/81  Pulse: 63 68  Resp: 12 10  Temp:  36.5 C  SpO2: 99% 98%    Last Pain:  Vitals:   11/17/20 2044  TempSrc: Oral  PainSc: [place]

## 2020-11-17 NOTE — Discharge Instructions (Signed)

## 2020-11-17 NOTE — Anesthesia Preprocedure Evaluation (Signed)
Anesthesia Evaluation  Patient identified by MRN, date of birth, ID band Patient awake    Reviewed: Allergy & Precautions, H&P , NPO status , Patient's Chart, lab work & pertinent test results  History of Anesthesia Complications (+) Family history of anesthesia reaction  Airway Mallampati: II  TM Distance: >3 FB Neck ROM: full    Dental no notable dental hx.    Pulmonary former smoker,    Pulmonary exam normal breath sounds clear to auscultation       Cardiovascular Normal cardiovascular exam Rhythm:Regular Rate:Normal     Neuro/Psych Seizures -,     GI/Hepatic GERD  ,  Endo/Other  diabetes  Renal/GU Renal disease     Musculoskeletal  (+) Arthritis ,   Abdominal   Peds  Hematology   Anesthesia Other Findings   Reproductive/Obstetrics                             Anesthesia Physical  Anesthesia Plan  ASA: 2  Anesthesia Plan: General   Post-op Pain Management:  Regional for Post-op pain   Induction: Intravenous, Rapid sequence and Cricoid pressure planned  PONV Risk Score and Plan: 4 or greater and Ondansetron, Dexamethasone and Treatment may vary due to age or medical condition  Airway Management Planned: Oral ETT  Additional Equipment: None  Intra-op Plan:   Post-operative Plan: Extubation in OR  Informed Consent: I have reviewed the patients History and Physical, chart, labs and discussed the procedure including the risks, benefits and alternatives for the proposed anesthesia with the patient or authorized representative who has indicated his/her understanding and acceptance.     Dental advisory given  Plan Discussed with: CRNA  Anesthesia Plan Comments:         Anesthesia Quick Evaluation

## 2020-11-17 NOTE — Anesthesia Procedure Notes (Signed)
Procedure Name: Intubation Date/Time: 11/17/2020 7:19 PM Performed by: Cleda Daub, CRNA Pre-anesthesia Checklist: Patient identified, Emergency Drugs available, Suction available and Patient being monitored Patient Re-evaluated:Patient Re-evaluated prior to induction Oxygen Delivery Method: Circle system utilized Preoxygenation: Pre-oxygenation with 100% oxygen Induction Type: IV induction, Rapid sequence and Cricoid Pressure applied Laryngoscope Size: Mac and 4 Grade View: Grade II Tube type: Oral Tube size: 7.5 mm Number of attempts: 1 Airway Equipment and Method: Stylet and Oral airway Placement Confirmation: ETT inserted through vocal cords under direct vision, positive ETCO2 and breath sounds checked- equal and bilateral Secured at: 24 cm Tube secured with: Tape Dental Injury: Teeth and Oropharynx as per pre-operative assessment

## 2020-11-17 NOTE — H&P (Signed)
f/u to monitor Prostate Cancer  HPI: [name] is a 70 year-old male established patient who is here for interval evaluation of his prostate cancer.  TRUS - 31 gm, small median lobe was present  IPSS- 11, Qol 2, SHIM 20  The patient developed urinary retention and subsequently difficulty voiding at the tail end of radiation. He underwent suprapubic tube placement the emergency department. Immediately following radiation he did struggle for symptoms of urinary retention. Things improved overtime.   May 2022 Interval: The patient presents today for follow-up of his prostate cancer. The patient has a very weak stream and significant urinary frequency. He gets up 4 times nightly to void.   11/17/2020: Here today for evaluation of worsening lower urinary tract symptoms. Symptoms began acutely about 2-3 days ago. He is describing increased urgency with mixed urge and overflow incontinence currently managed with placing a towel in his underwear. He is also having increased hesitancy/straining to initiate any type of urine flow which is described as no more than a dribble. He has had some worsening fecal incontinence with the excessive straining but denies true dysuria or gross hematuria. Not associated with fevers or chills, nausea/vomiting, unilateral lower back or flank pain/discomfort suggestive of obstructive uropathy. Urinalysis today is clear, bedside PVR is 724m.   The patient was last seen May 2022.   He was diagnosed with prostate cancer in approximately 09/22/2017. The patient was treated with external beam radiation. He did not receive hormonal therapy around his radiation. The patient started/underwent treatemt on 8/19-10/19. The patient's gleason score was: 4+3=7 - 1/12 cores (40%) LLB, 3+3= 6 - 2/12 cores on right (5 and 10%). Pretreatment PSA: 4.65.   The patient's most recent PSA was 0.62. This was drawn on approximately 09/04/2020.   PSA History: 9/21: 0.65, 3/21: 0.72, 11/20: 0.92,  9/20: 1.38, 2/20: 1.95.   He does not have biochemical recurrence of his prostate cancer.   The patient is having urinary incontinence. The patient denies any new bone pain, new back pain, or lower extremity edema. The patient has developed frequency, urgency, intermittency, and incontinence.     ALLERGIES: No Allergies    MEDICATIONS: Aspirin  Metformin Hcl  Simvastatin  Amitiza  Dexilant  Fenofibrate  Folic Acid  Keppra  Sildenafil Citrate 20 mg tablet Take 2-5 tablets daily as needed     GU PSH: Cystoscopy - 2019 Insert Bladder Cath; Complex - 2019 PLACE RT DEVICE/MARKER, PROS - 2019 Prostate Needle Biopsy - 2019 Transperineal Plmt Biodegradable Matrl 1/Mlt Njx - 2019       PSH Notes: Meniscus Tear-2013   NON-GU PSH: Shoulder Surgery (Unspecified) - 2014 Surgical Pathology, Gross And Microscopic Examination For Prostate Needle - 2019     GU PMH: History of prostate cancer - 09/11/2020, - 02/25/2020, - 07/03/2019, - 01/01/2019, - 2020, - 2019 Straining on Urination - 09/11/2020, - 02/25/2020, - 2019 Weak Urinary Stream - 09/11/2020, - 2018 ED due to arterial insufficiency - 02/25/2020, - 2018 Urge incontinence - 02/12/2019, - 01/22/2019 Urinary Retention, Unspec - 02/12/2019, - 01/22/2019, - 12/18/2018, - 12/11/2018, - 12/04/2018, - 11/27/2018, - 11/20/2018, - 2020 Pelvic/perineal pain - 11/27/2018 Dysuria - 2020 Urinary Retention - 2019 Elevated PSA - 2019, - 2018    NON-GU PMH: Constipation, unspecified - 02/12/2019, - 01/22/2019, - 12/18/2018, - 12/11/2018, - 12/04/2018, - 11/27/2018, - 11/20/2018, - 2020 Muscle weakness (generalized) - 02/12/2019, - 01/22/2019, - 12/18/2018, - 12/11/2018, - 12/04/2018, - 11/27/2018, - 11/20/2018, - 2020, - 2020 Other muscle spasm -  02/12/2019, - 01/22/2019, - 12/18/2018, - 12/11/2018, - 12/04/2018, - 11/27/2018, - 11/20/2018, - 2020, - 2020 Diabetes Type 2 GERD Hypercholesterolemia Seizure disorder Skin Cancer, History    FAMILY HISTORY: 2 daughters -  Daughter Kidney Failure - Father Prostate Cancer - Brother, Father   SOCIAL HISTORY: Marital Status: Married Preferred Language: English; Ethnicity: ; Race: White Current Smoking Status: Patient has never smoked.   Tobacco Use Assessment Completed: Used Tobacco in last 30 days? Drinks 1 caffeinated drink per day. Patient's occupation Community education officer.    REVIEW OF SYSTEMS:    GU Review Male:   Patient reports frequent urination, hard to postpone urination, get up at night to urinate, leakage of urine, stream starts and stops, trouble starting your stream, and have to strain to urinate . Patient denies burning/ pain with urination, erection problems, and penile pain.  Gastrointestinal (Upper):   Patient denies nausea, vomiting, and indigestion/ heartburn.  Gastrointestinal (Lower):   Patient denies diarrhea and constipation.  Constitutional:   Patient denies fever, night sweats, weight loss, and fatigue.  Skin:   Patient denies skin rash/ lesion and itching.  Eyes:   Patient denies blurred vision and double vision.  Ears/ Nose/ Throat:   Patient denies sore throat and sinus problems.  Hematologic/Lymphatic:   Patient denies easy bruising and swollen glands.  Cardiovascular:   Patient denies leg swelling and chest pains.  Respiratory:   Patient denies cough and shortness of breath.  Endocrine:   Patient denies excessive thirst.  Musculoskeletal:   Patient denies back pain and joint pain.  Neurological:   Patient denies headaches and dizziness.  Psychologic:   Patient denies depression and anxiety.   VITAL SIGNS:      11/17/2020 03:31 PM  BP 136/80 mmHg  Heart Rate 69 /min  Temperature 98.2 F / 36.7 C   GU PHYSICAL EXAMINATION:    Scrotum: No lesions. No edema. No cysts. No warts.  Epididymides: Right: no spermatocele, no masses, no cysts, no tenderness, no induration, no enlargement. Left: no spermatocele, no masses, no cysts, no tenderness, no induration, no enlargement.  Testes:  No tenderness, no swelling, no enlargement left testes. No tenderness, no swelling, no enlargement right testes. Normal location left testes. Normal location right testes. No mass, no cyst, no varicocele, no hydrocele left testes. No mass, no cyst, no varicocele, no hydrocele right testes.  Urethral Meatus: Normal size. No lesion, no wart, no discharge, no polyp. Normal location.  Penis: Circumcised, no warts, no cracks. No dorsal Peyronie's plaques, no left corporal Peyronie's plaques, no right corporal Peyronie's plaques, no scarring, no warts. No balanitis, no meatal stenosis.  Seminal Vesicles: Nonpalpable.   MULTI-SYSTEM PHYSICAL EXAMINATION:    Constitutional: Well-nourished. No physical deformities. Normally developed. Good grooming.  Neck: Neck symmetrical, not swollen. Normal tracheal position.  Respiratory: Normal breath sounds. No labored breathing, no use of accessory muscles.   Cardiovascular: Regular rate and rhythm. No murmur, no gallop. Normal temperature, normal extremity pulses, no swelling, no varicosities.   Skin: No paleness, no jaundice, no cyanosis. No lesion, no ulcer, no rash.  Neurologic / Psychiatric: Oriented to time, oriented to place, oriented to person. No depression, no anxiety, no agitation.  Gastrointestinal: No mass, no tenderness, no rigidity, non obese abdomen.  Musculoskeletal: Normal gait and station of head and neck.     Complexity of Data:  Source Of History:  Patient, Medical Record Summary  Lab Test Review:   PSA  Records Review:   Pathology Reports, Previous  Doctor Records, Previous Hospital Records, Previous Patient Records  Urine Test Review:   Urinalysis  Urodynamics Review:   Review Bladder Scan   08/18/20 12/26/19 06/26/19 03/05/19 12/29/18 06/12/18 08/01/17 04/11/17  PSA  Total PSA 0.61 ng/mL 0.65 ng/mL 0.71 ng/mL 0.92 ng/dl 1.38 ng/mL 1.95 ng/mL 4.65 ng/mL 4.84 ng/mL  Free PSA       0.78 ng/mL 1.13 ng/mL  % Free PSA       17 % PSA 23 % PSA     PROCEDURES:         PVR Ultrasound - 51798  Scanned Volume: 774 cc         Urinalysis Dipstick Dipstick Cont'd  Color: Yellow Bilirubin: Neg mg/dL  Appearance: Clear Ketones: Neg mg/dL  Specific Gravity: 1.020 Blood: Neg ery/uL  pH: <=5.0 Protein: Neg mg/dL  Glucose: Neg mg/dL Urobilinogen: 0.2 mg/dL    Nitrites: Neg    Leukocyte Esterase: Neg leu/uL    ASSESSMENT:      ICD-10 Details  1 GU:   Urinary Retention - R33.8 Acute, Threat to Bodily Function  2   History of prostate cancer - Z85.46 Chronic, Stable   PLAN:           Orders Labs Urine Culture          Schedule Return Visit/Planned Activity: 1 Week - Office Visit- No Urine, Voiding Trial  Return Visit/Planned Activity: ASAP - Schedule Surgery          Document Letter(s):  Created for Patient: Clinical Summary         Notes:   I was unable to place a Foley catheter x2. On-call urologist called to bedside where cystoscopic examination occurred. This revealed a fairly dense stricture along the urethra with a pinpoint opening, Dr Jeffie Pollock was able to easily pass a guidewire. Dilation was unsuccessful utilizing the smallest available Heyman dilator. Decision ultimately made to take the patient to the operating room this evening for cystoscopy and hopeful urethral dilation under anesthesia. If this is unsuccessful, the patient will likely need suprapubic catheter placement which previously occurred in the setting of acute urinary retention in the emergency department back in 2019. Risks versus benefits, potential for adverse reaction including infection, structural damage, worsening incontinence all communicated in detail. All questions answered to the best of my and the on-call urologist ability about expected procedure course peri and postoperatively. He elects to proceed. I will f/u with him next week where a TOV will occur. I'll likely teach him CIC at that time as well in an effort to maintain the patency of his structure  and prevent further recurrences of urinary retention moving forward.         Next Appointment:      Next Appointment: 11/25/2020 01:15 PM    Appointment Type: Postoperative Appointment    Location: [facility] [phone]    Provider: Jiles Crocker, NP    Reason for Visit: POST PO

## 2020-11-17 NOTE — Op Note (Addendum)
Preoperative Diagnosis: Urethral stricture, urinary retention  Postoperative Diagnosis: Same  Procedure(s) Performed:  Procedure(s): CYSTOSCOPY, [place]  Surgeon:  Surgeon(s): Irine Seal, MD  Resident Surgeon: Bishop Limbo, MD  Anesthesia:  General  Fluids:  See anesthesia record  Estimated blood loss:   We will  Specimens: None  Drains: 66 French council tip three-way catheter with 10 cc of sterile water in the balloon to bag drainage  Complications:  None  Indications:  This is a 70 y.o. patient with a history of radiation, urinary retention due to urethral stricture that used to be suprapubic catheter dependent.  Presents to clinic and urinary retention with a bladder scan of 775 cc.  Bedside catheter placement was unsuccessful.. After discussion of the risks & benefits and alternatives to surgical approach, the patient wishes to proceed with cystoscopy, urethral dilation, Foley placement. Risks & benefits of the procedure discussed with the patient, who wishes to proceed.   Findings: Pinpoint stricture about 1.5 cm long in the proximal bulbar and membranous urethra.  Sensor wire cannulated.  Unable to advance the semirigid ureteroscope through the stricture.  Able to advance 24 French 15 cm balloon dilation device.  After dilation, able to pass the cystoscope into the bladder.  Successful Foley catheter placement.  Procedure:  Patient was brought into the operating room and given general anesthesia by our anesthesia colleagues.  He was placed in the dorsolithotomy position.  He was prepped and draped in the usual manner.  He was given perioperative antibiotics with Ancef.  A timeout was called.    We began by performing cystoscopy with a 81 French rigid cystoscope.  We advanced this to the level of the bulbar urethra at which point we noted the above findings.  A pinpoint urethral stricture beyond which we could not advance.  We switched to  our dual-lumen semirigid ureteroscope.  We advanced it to the level of the stricture and then cannulated the stricture with a sensor wire.  This was advanced and curled into the bladder under fluoroscopic guidance.  We were unable to advance the semirigid ureteroscope over the wire past the level of the stricture due to significant resistance and concern that we could raise a flap or create a false passage.  We therefore withdrew the semirigid ureteroscope leaving the sensor wire in place.  Over the sensor wire we passed a 24 French 15 cm balloon for dilation.  Under fluoroscopic guidance it was advanced with the distal end of the balloon in the prostatic urethra.  We then dilated up to 20 mmHg with contrast within the balloon under fluoroscopic guidance.  We let this sit for about 4 minutes.  Next we deflated the balloon.  We then performed cystoscopy with a 13 French rigid cystoscope alongside the wire.  We were able to advance this past the noted stricture.  The prostatic urethra was patent.  The bladder was full of clear yellow urine and it had moderate trabeculations.  We withdrew our cystoscope leaving the wire in place and leaving the bladder full.  We then advanced an 63 [place] council tip Foley catheter over a wire into the bladder.  We are yellow urine return.  10 cc of sterile water were placed into the balloon.    Plan: 1.  Patient will be discharged with a leg bag and a larger drainage bag 2.  Follow-up has been arranged with Jiles Crocker on 11/25/2020.  Patient will keep this appointment and likely will undergo trial void  and CIC teaching.  Can also discuss suprapubic catheter once more if the patient is interested.

## 2020-11-18 ENCOUNTER — Encounter (HOSPITAL_COMMUNITY): Payer: Self-pay | Admitting: Urology

## 2020-12-29 DIAGNOSIS — N1831 Chronic kidney disease, stage 3a: Secondary | ICD-10-CM | POA: Diagnosis not present

## 2021-01-02 DIAGNOSIS — Z20822 Contact with and (suspected) exposure to covid-19: Secondary | ICD-10-CM | POA: Diagnosis not present

## 2021-01-06 DIAGNOSIS — N1831 Chronic kidney disease, stage 3a: Secondary | ICD-10-CM | POA: Diagnosis not present

## 2021-01-06 DIAGNOSIS — Z23 Encounter for immunization: Secondary | ICD-10-CM | POA: Diagnosis not present

## 2021-01-06 DIAGNOSIS — E1122 Type 2 diabetes mellitus with diabetic chronic kidney disease: Secondary | ICD-10-CM | POA: Diagnosis not present

## 2021-01-06 DIAGNOSIS — N2581 Secondary hyperparathyroidism of renal origin: Secondary | ICD-10-CM | POA: Diagnosis not present

## 2021-01-06 DIAGNOSIS — D631 Anemia in chronic kidney disease: Secondary | ICD-10-CM | POA: Diagnosis not present

## 2021-01-12 DIAGNOSIS — Z01110 Encounter for hearing examination following failed hearing screening: Secondary | ICD-10-CM | POA: Diagnosis not present

## 2021-01-13 DIAGNOSIS — G8929 Other chronic pain: Secondary | ICD-10-CM | POA: Diagnosis not present

## 2021-01-13 DIAGNOSIS — M25561 Pain in right knee: Secondary | ICD-10-CM | POA: Diagnosis not present

## 2021-01-13 DIAGNOSIS — M25562 Pain in left knee: Secondary | ICD-10-CM | POA: Diagnosis not present

## 2021-01-30 DIAGNOSIS — D485 Neoplasm of uncertain behavior of skin: Secondary | ICD-10-CM | POA: Diagnosis not present

## 2021-01-30 DIAGNOSIS — D225 Melanocytic nevi of trunk: Secondary | ICD-10-CM | POA: Diagnosis not present

## 2021-01-30 DIAGNOSIS — Z1283 Encounter for screening for malignant neoplasm of skin: Secondary | ICD-10-CM | POA: Diagnosis not present

## 2021-01-30 DIAGNOSIS — L570 Actinic keratosis: Secondary | ICD-10-CM | POA: Diagnosis not present

## 2021-01-30 DIAGNOSIS — X32XXXD Exposure to sunlight, subsequent encounter: Secondary | ICD-10-CM | POA: Diagnosis not present

## 2021-01-30 DIAGNOSIS — D2272 Melanocytic nevi of left lower limb, including hip: Secondary | ICD-10-CM | POA: Diagnosis not present

## 2021-02-02 DIAGNOSIS — Z8546 Personal history of malignant neoplasm of prostate: Secondary | ICD-10-CM | POA: Diagnosis not present

## 2021-02-02 DIAGNOSIS — R8279 Other abnormal findings on microbiological examination of urine: Secondary | ICD-10-CM | POA: Diagnosis not present

## 2021-02-02 DIAGNOSIS — N3941 Urge incontinence: Secondary | ICD-10-CM | POA: Diagnosis not present

## 2021-02-02 DIAGNOSIS — N5201 Erectile dysfunction due to arterial insufficiency: Secondary | ICD-10-CM | POA: Diagnosis not present

## 2021-02-02 DIAGNOSIS — N35012 Post-traumatic membranous urethral stricture: Secondary | ICD-10-CM | POA: Diagnosis not present

## 2021-02-03 DIAGNOSIS — M1711 Unilateral primary osteoarthritis, right knee: Secondary | ICD-10-CM | POA: Diagnosis not present

## 2021-02-03 DIAGNOSIS — Z8042 Family history of malignant neoplasm of prostate: Secondary | ICD-10-CM | POA: Insufficient documentation

## 2021-02-03 DIAGNOSIS — M25561 Pain in right knee: Secondary | ICD-10-CM | POA: Diagnosis not present

## 2021-02-03 DIAGNOSIS — M25569 Pain in unspecified knee: Secondary | ICD-10-CM | POA: Insufficient documentation

## 2021-02-07 ENCOUNTER — Other Ambulatory Visit: Payer: Self-pay

## 2021-02-07 ENCOUNTER — Emergency Department (HOSPITAL_COMMUNITY)
Admission: EM | Admit: 2021-02-07 | Discharge: 2021-02-07 | Disposition: A | Discharge status: Home / Self Care | Payer: Medicare PPO | Attending: Emergency Medicine | Admitting: Emergency Medicine

## 2021-02-07 ENCOUNTER — Encounter (HOSPITAL_COMMUNITY): Payer: Self-pay | Admitting: Emergency Medicine

## 2021-02-07 DIAGNOSIS — Z8546 Personal history of malignant neoplasm of prostate: Secondary | ICD-10-CM | POA: Diagnosis not present

## 2021-02-07 DIAGNOSIS — N35919 Unspecified urethral stricture, male, unspecified site: Secondary | ICD-10-CM | POA: Insufficient documentation

## 2021-02-07 DIAGNOSIS — Z5321 Procedure and treatment not carried out due to patient leaving prior to being seen by health care provider: Secondary | ICD-10-CM | POA: Insufficient documentation

## 2021-02-07 DIAGNOSIS — R339 Retention of urine, unspecified: Secondary | ICD-10-CM | POA: Diagnosis not present

## 2021-02-07 DIAGNOSIS — N390 Urinary tract infection, site not specified: Secondary | ICD-10-CM | POA: Insufficient documentation

## 2021-02-07 LAB — URINALYSIS, ROUTINE W REFLEX MICROSCOPIC
Bilirubin Urine: NEGATIVE
Glucose, UA: NEGATIVE mg/dL
Hgb urine dipstick: NEGATIVE
Ketones, ur: NEGATIVE mg/dL
Nitrite: NEGATIVE
Protein, ur: 30 mg/dL — AB
Specific Gravity, Urine: 1.018 (ref 1.005–1.030)
WBC, UA: >50 WBC/hpf — ABNORMAL HIGH (ref 0–5)
pH: 5 (ref 5.0–8.0)

## 2021-02-07 NOTE — ED Provider Notes (Signed)
Emergency Medicine Provider Triage Evaluation Note  [name] , a 70 y.o. male  was evaluated in triage.  Pt complains of urinary retention x1 day.  Patient has history of prostate cancer urethral stricture.  Had discussion with urology earlier this week about transitioning to self-catheterization at home.  Currently on antibiotics for bladder infection, has taken 3 doses.  He can feel his bladder distending, no spasms.  Of note he states that in the past he has been a difficult cath.  Review of Systems  Positive: Urinary retention Negative: Fever, chills, abdominal pain, diarrhea, constipation  Physical Exam  BP 128/79 (BP Location: Right Arm)   Pulse 86   Temp 97.9 F (36.6 C) (Oral)   Resp 16   Ht 6\' 1"  (1.854 m)   Wt 94 kg   SpO2 99%   BMI 27.34 kg/m  Gen:   Awake, no distress   Resp:  Normal effort  MSK:   Moves extremities without difficulty  Other:    Medical Decision Making  Medically screening exam initiated at 2:46 PM.  Appropriate orders placed.  [name] was informed that the remainder of the evaluation will be completed by another provider, this initial triage assessment does not replace that evaluation, and the importance of remaining in the ED until their evaluation is complete.     Dominic Plummer, PA-C 02/07/21 1447    Dominic Sparrow, DO 02/08/21 1309

## 2021-02-07 NOTE — ED Provider Notes (Signed)
Patient left prior to my assessment. He received MSE exam but I did not personally evaluate this patient prior to his departure from the ED.    Jeanell Sparrow, DO 02/08/21 1423

## 2021-02-07 NOTE — ED Triage Notes (Signed)
Complains of not being able to urinate, has had this issue in the past, per urology patient needs to self-cath. Has hx of prostate cancer. Also is on meds for a 'bladder infection' has taken it twice. States he might need another procedure, hx of urethral stricture.

## 2021-02-08 ENCOUNTER — Emergency Department (HOSPITAL_COMMUNITY)
Admission: EM | Admit: 2021-02-08 | Discharge: 2021-02-08 | Disposition: A | Discharge status: Home / Self Care | Payer: Medicare PPO | Attending: Emergency Medicine | Admitting: Emergency Medicine

## 2021-02-08 ENCOUNTER — Encounter (HOSPITAL_COMMUNITY): Payer: Self-pay | Admitting: Emergency Medicine

## 2021-02-08 ENCOUNTER — Other Ambulatory Visit: Payer: Self-pay

## 2021-02-08 DIAGNOSIS — R339 Retention of urine, unspecified: Secondary | ICD-10-CM | POA: Diagnosis not present

## 2021-02-08 DIAGNOSIS — N189 Chronic kidney disease, unspecified: Secondary | ICD-10-CM | POA: Insufficient documentation

## 2021-02-08 DIAGNOSIS — Z8546 Personal history of malignant neoplasm of prostate: Secondary | ICD-10-CM | POA: Insufficient documentation

## 2021-02-08 DIAGNOSIS — Z7984 Long term (current) use of oral hypoglycemic drugs: Secondary | ICD-10-CM | POA: Insufficient documentation

## 2021-02-08 DIAGNOSIS — E119 Type 2 diabetes mellitus without complications: Secondary | ICD-10-CM | POA: Insufficient documentation

## 2021-02-08 DIAGNOSIS — Z87891 Personal history of nicotine dependence: Secondary | ICD-10-CM | POA: Diagnosis not present

## 2021-02-08 DIAGNOSIS — N35812 Other urethral bulbous stricture, male: Secondary | ICD-10-CM | POA: Diagnosis not present

## 2021-02-08 DIAGNOSIS — R338 Other retention of urine: Secondary | ICD-10-CM | POA: Diagnosis not present

## 2021-02-08 LAB — CBC
HCT: 39.9 % (ref 39.0–52.0)
Hemoglobin: 13.6 g/dL (ref 13.0–17.0)
MCH: 33.3 pg (ref 26.0–34.0)
MCHC: 34.1 g/dL (ref 30.0–36.0)
MCV: 97.6 fL (ref 80.0–100.0)
Platelets: 229 10*3/uL (ref 150–400)
RBC: 4.09 MIL/uL — ABNORMAL LOW (ref 4.22–5.81)
RDW: 12 % (ref 11.5–15.5)
WBC: 12.9 10*3/uL — ABNORMAL HIGH (ref 4.0–10.5)
nRBC: 0 % (ref 0.0–0.2)

## 2021-02-08 LAB — BASIC METABOLIC PANEL
Anion gap: 10 (ref 5–15)
BUN: 27 mg/dL — ABNORMAL HIGH (ref 8–23)
CO2: 20 mmol/L — ABNORMAL LOW (ref 22–32)
Calcium: 9.1 mg/dL (ref 8.9–10.3)
Chloride: 106 mmol/L (ref 98–111)
Creatinine, Ser: 1.77 mg/dL — ABNORMAL HIGH (ref 0.61–1.24)
GFR, Estimated: 41 mL/min — ABNORMAL LOW (ref >60)
Glucose, Bld: 139 mg/dL — ABNORMAL HIGH (ref 70–99)
Potassium: 4.3 mmol/L (ref 3.5–5.1)
Sodium: 136 mmol/L (ref 135–145)

## 2021-02-08 NOTE — Consult Note (Signed)
Reason for Consult:Urinary Retention / Bulbar Urethral Stricture, Moderate Risk Prostate Cancer  Referring Physician: Wynona Dove Do  [name] is an 70 y.o. male.   HPI:   1 - Urinary Retention / Bulbar Urethral Stricture - new high grade bulbar strictrue by operative cysto / dilation 11/2020. Passed trial of void and compliane with PVR monitoring. Developed recurrent frank retention 10/202 req ER dilation / catheter as per below 1.8L in bladder.  2 - Moderate Risk Prostate Cancer - s/p radiation 2020 for moderate risk cancer.   PMH sig for DM2, ortho surgery. No blood thinners. 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 Family in the area since 88s emigrated from [place].  Today "[name]" is seen as emergent consult for above. He is traveling to [place] in 2 days for 2 weeks and does not want to change plans.  Single attempt RN passage of 19F catheter not succesful.   Past Medical History:  Diagnosis Date   Arthritis    Chronic kidney disease    Diabetes mellitus without complication (HCC)    Family history of adverse reaction to anesthesia    Family history of prostate cancer    GERD (gastroesophageal reflux disease)    Hyperlipemia    Hypertriglyceridemia    Plantar fasciitis, bilateral    Prostate cancer ([place])    Seizures ([place])    started age 88-last one 60   Urinary retention    Wears glasses     Past Surgical History:  Procedure Laterality Date   BIOPSY PROSTATE  09/19/2017   BRONCHOSCOPY  8/10   removal vocal cord polyp   CAPSULAR RELEASE Left 10/02/2012   Procedure: LEFT ARTHROSCOPY CAPSULAR RELEASE;  Surgeon: Nita Sells, MD;  Location: [facility];  Service: Orthopedics;  Laterality: Left;   COLONOSCOPY     CYSTOSCOPY N/A 11/17/2020   Procedure: CYSTOSCOPY, URETHRAL DILATATION,FOLEY CATH PLACEMENT,BALLOON DILATATION, URETEROSCOPY;  Surgeon: Irine Seal, MD;  Location: [facility];  Service: Urology;   Laterality: N/A;   KNEE ARTHROSCOPY  5/13   lt   POLYPECTOMY  1988   vocal cords   TONSILLECTOMY     TRANSRECTAL ULTRASOUND  09/19/2017    Family History  Problem Relation Age of Onset   Arthritis Mother    Dementia Mother    Cancer - Prostate Father 31   Cancer - Prostate Brother 21   Alzheimer's disease Maternal Uncle    Autism Daughter     Social History:  reports that he has quit smoking. His smoking use included cigarettes. He has a 10.00 pack-year smoking history. He has never used smokeless tobacco. He reports that he does not drink alcohol and does not use drugs.  Allergies:  Allergies  Allergen Reactions   Lansoprazole     Other reaction(s): Did not help    Medications: I have reviewed the patient's current medications.  Results for orders placed or performed during the hospital encounter of 02/07/21 (from the past 48 hour(s))  Urinalysis, Routine w reflex microscopic Urine, In & Out Cath     Status: Abnormal   Collection Time: 02/07/21  5:50 PM  Result Value Ref Range   Color, Urine YELLOW YELLOW   APPearance HAZY (A) CLEAR   Specific Gravity, Urine 1.018 1.005 - 1.030   pH 5.0 5.0 - 8.0   Glucose, UA NEGATIVE NEGATIVE mg/dL   Hgb urine dipstick NEGATIVE NEGATIVE   Bilirubin Urine NEGATIVE NEGATIVE   Ketones, ur NEGATIVE NEGATIVE  mg/dL   Protein, ur 30 (A) NEGATIVE mg/dL   Nitrite NEGATIVE NEGATIVE   Leukocytes,Ua LARGE (A) NEGATIVE   RBC / HPF 6-10 0 - 5 RBC/hpf   WBC, UA >50 (H) 0 - 5 WBC/hpf   Bacteria, UA RARE (A) NONE SEEN   Squamous Epithelial / LPF 0-5 0 - 5   Mucus PRESENT    Crystals PRESENT (A) NEGATIVE    Comment: Performed at [facility], [place] [address], [place], [place] [address]    No results found.  Review of Systems  Constitutional:  Negative for chills and fever.  Genitourinary:  Positive for difficulty urinating.  All other systems reviewed and are negative. Blood pressure (!) 169/98, pulse 83, temperature  97.9 F (36.6 C), temperature source Oral, resp. rate 18, SpO2 99 %. Physical Exam Vitals reviewed.  Constitutional:      Comments: In visible pain in ER.   HENT:     Right Ear: Tympanic membrane normal.  Eyes:     Pupils: Pupils are equal, round, and reactive to light.  Cardiovascular:     Comments: Sinus tach from pain.  Pulmonary:     Effort: Pulmonary effort is normal.     Comments: Inreased rate of breathign from pain.  Abdominal:     General: Abdomen is flat.  Genitourinary:    Penis: Normal.   Musculoskeletal:        General: Normal range of motion.     Cervical back: Normal range of motion.  Skin:    General: Skin is warm.  Neurological:     General: No focal deficit present.     Mental Status: He is alert.  Psychiatric:        Mood and Affect: Mood normal.    BEDSIDE CYSTO / DILATION:  Using aseptic technique penis prepped with betadine and 10cc viscous lidocaine placed per urethra. 72F flexible cystoscope used to perform cystoscopy. High grade stricture at bulb with true lumen 12 o clock. Marland Kitchen038 sensor wire easily passed. 10F balloon dilation apparatus placed across verified cystoscopically, inflated to 15atm. Inspection proximal with unremarkable prostate, trabeculated bladder. 10F council catheter placed over wire. 10cc water in balloon. Immediate efflux 1.8L clear urine and immediate pain relief. Pt very grateful.   Assessment/Plan:  1 - Urinary Retention / Bulbar Urethral Stricture - temporized again with dilation / catheter. Would likely benefit from formal OR dilation with Mito-C into scar then urethroplasty v. SPT if fails again.   He will keep current foley on his upcoming trip. He is facile with care. I discussed that we would be happy to write him medical necessity letter for cancelling his trip (recommended).   2 - Moderate Risk Prostate Cancer - good biochemical control  OK for ER DC. We will request Urol FU week of 11/14 if possible.   Alexis Frock 02/08/2021, 8:43 PM

## 2021-02-08 NOTE — ED Notes (Signed)
Patient was called for triage but no response.

## 2021-02-08 NOTE — ED Triage Notes (Signed)
Complains of urinary retention since 7am this morning, reports yellow urine this morning. Tried to get seen yesterday, urinated while waiting in the lobby so he went home.

## 2021-02-08 NOTE — ED Provider Notes (Signed)
Emergency Medicine Provider Triage Evaluation Note  JEROMIE GAINOR , a 70 y.o. male  was evaluated in triage.  Pt complains of urinary retention since yesterday.  He was seen and evaluated here yesterday for the same but left due to wait time.  He was able to urinate yesterday and this morning around 7 AM but has been unable to urinate since.  He reports associated suprapubic abdominal pain characterized as pressure sensation.  He does have a history of prostate cancer with urethral stricture.  No fever or chills.  Review of Systems  Positive:  Negative: See above   Physical Exam  BP (!) 169/98 (BP Location: Left Arm)   Pulse 83   Temp 97.9 F (36.6 C) (Oral)   Resp 18   SpO2 99%  Gen:   Awake, no distress   Resp:  Normal effort  MSK:   Moves extremities without difficulty  Other:  Suprapubic abdominal tenderness.  Medical Decision Making  Medically screening exam initiated at 5:27 PM.  Appropriate orders placed.  ELDRIDGE MARCOTT was informed that the remainder of the evaluation will be completed by another provider, this initial triage assessment does not replace that evaluation, and the importance of remaining in the ED until their evaluation is complete.     Hendricks Limes, PA-C 02/08/21 [place] A, DO 02/08/21 2358

## 2021-02-08 NOTE — ED Provider Notes (Signed)
[facility] DEPT Provider Note   CSN: [id number] Arrival date & time: 02/08/21  1704     History Chief Complaint  Patient presents with   Urinary Retention    [name] is a 70 y.o. male.  This is a 70 y.o. male with significant medical history as below, including urinary obstruction who presents to the ED with complaint of difficulty voiding.   Patient reports history of chronic difficulty with voiding.  He follows with urology as an outpatient.  Patient reports over the past 2 days he has had worsening in his ability to void spontaneously.  He has had no spontaneous voids over the past 14 hours.  The last time he urinated was approximately 7 AM this morning. No nausea or vomiting, no change to bowel function. Increasing discomfort to suprapubic region, pressure.  Described as aching.  Non Radiating.  No alleviating factors reported.  The history is provided by the patient. No language interpreter was used.      Past Medical History:  Diagnosis Date   Arthritis    Chronic kidney disease    Diabetes mellitus without complication ([place])    Family history of adverse reaction to anesthesia    Family history of prostate cancer    GERD (gastroesophageal reflux disease)    Hyperlipemia    Hypertriglyceridemia    Plantar fasciitis, bilateral    Prostate cancer ([facility])    Seizures ([place])    started age 64-last one 59   Urinary retention    Wears glasses     Patient Active Problem List   Diagnosis Date Noted   Urinary retention 01/17/2018   Genetic testing 12/02/2017   Family history of prostate cancer    Malignant neoplasm of prostate ([place]) 10/18/2017   Seizures ([place]) 10/09/2012   Osteoarthritis of left shoulder 10/09/2012    Past Surgical History:  Procedure Laterality Date   BIOPSY PROSTATE  09/19/2017   BRONCHOSCOPY  8/10   removal vocal cord polyp   CAPSULAR RELEASE Left 10/02/2012   Procedure: LEFT ARTHROSCOPY CAPSULAR  RELEASE;  Surgeon: Nita Sells, MD;  Location: [facility];  Service: Orthopedics;  Laterality: Left;   COLONOSCOPY     CYSTOSCOPY N/A 11/17/2020   Procedure: CYSTOSCOPY, URETHRAL DILATATION,FOLEY CATH PLACEMENT,BALLOON DILATATION, URETEROSCOPY;  Surgeon: Irine Seal, MD;  [facility];  Service: Urology;  Laterality: N/A;   KNEE ARTHROSCOPY  5/13   lt   POLYPECTOMY  1988   vocal cords   TONSILLECTOMY     TRANSRECTAL ULTRASOUND  09/19/2017       Family History  Problem Relation Age of Onset   Arthritis Mother    Dementia Mother    Cancer - Prostate Father 76   Cancer - Prostate Brother 51   Alzheimer's disease Maternal Uncle    Autism Daughter     Social History   Tobacco Use   Smoking status: Former    Packs/day: 0.50    Years: 20.00    Pack years: 10.00    Types: Cigarettes   Smokeless tobacco: Never   Tobacco comments:    pt denies ever smoking  Vaping Use   Vaping Use: Never used  Substance Use Topics   Alcohol use: No   Drug use: No    Home Medications Prior to Admission medications   Medication Sig Start Date End Date Taking? Authorizing Provider  bisacodyl (DULCOLAX) 10 MG suppository Place 1 suppository (10 mg total) rectally daily as needed for  moderate constipation. 01/17/18   Ardis Hughs, MD  calcium carbonate (OS-CAL) 600 MG TABS Take 1,200 mg by mouth 2 (two) times daily with a meal.     [provider]  Cholecalciferol 50 MCG (2000 UT) CAPS Take 2,000 Units by mouth daily.    [provider]  dexlansoprazole (DEXILANT) 60 MG capsule Take 60 mg by mouth daily.    [provider]  DULoxetine (CYMBALTA) 30 MG capsule Take 30 mg by mouth daily.    [provider]  fenofibrate micronized (LOFIBRA) 67 MG capsule Take 67 mg by mouth daily before breakfast.    [provider]  folic acid (FOLVITE) 211 MCG tablet Take 800 mcg by mouth daily.    [provider]   Levetiracetam 750 MG TB24 take 1 tablet by mouth every evening Patient taking differently: Take 750 mg by mouth daily. 10/16/14   Marcial Pacas, MD  lubiprostone (AMITIZA) 24 MCG capsule Take 24 mcg by mouth 2 (two) times daily with a meal.    [provider]  metFORMIN (GLUCOPHAGE) 500 MG tablet Take 500 mg by mouth 2 (two) times daily with a meal.    [provider]  Multiple Vitamins-Minerals (PRESERVISION AREDS PO) Take 1 capsule by mouth 2 (two) times daily.    [provider]  Omega-3 1000 MG CAPS Take 1,000 mg by mouth daily.    [provider]  pantoprazole (PROTONIX) 40 MG tablet Take 40 mg by mouth daily.    [provider]  simvastatin (ZOCOR) 40 MG tablet Take 40 mg by mouth daily.    [provider]  tamsulosin (FLOMAX) 0.4 MG CAPS capsule Take 1 capsule (0.4 mg total) by mouth daily after supper. 01/16/18   Tyler Pita, MD    Allergies    Lansoprazole  Review of Systems   Review of Systems  Constitutional:  Negative for chills and fever.  HENT:  Negative for facial swelling and trouble swallowing.   Eyes:  Negative for photophobia and visual disturbance.  Respiratory:  Negative for cough and shortness of breath.   Cardiovascular:  Negative for chest pain and palpitations.  Gastrointestinal:  Positive for abdominal pain. Negative for nausea and vomiting.  Endocrine: Negative for polydipsia and polyuria.  Genitourinary:  Positive for difficulty urinating. Negative for hematuria.  Musculoskeletal:  Negative for gait problem and joint swelling.  Skin:  Negative for pallor and rash.  Neurological:  Negative for syncope and headaches.  Psychiatric/Behavioral:  Negative for agitation and confusion.    Physical Exam Updated Vital Signs BP (!) 145/89 (BP Location: Right Arm)   Pulse 80   Temp 98 F (36.7 C) (Oral)   Resp 16   SpO2 99%   Physical Exam Vitals and nursing note reviewed.  Constitutional:      General:  He is not in acute distress.    Appearance: He is well-developed.  HENT:     Head: Normocephalic and atraumatic.     Right Ear: External ear normal.     Left Ear: External ear normal.     Mouth/Throat:     Mouth: Mucous membranes are moist.  Eyes:     General: No scleral icterus. Cardiovascular:     Rate and Rhythm: Normal rate and regular rhythm.     Pulses: Normal pulses.     Heart sounds: Normal heart sounds.  Pulmonary:     Effort: Pulmonary effort is normal. No respiratory distress.     Breath sounds: Normal  breath sounds.  Abdominal:     General: Abdomen is flat.     Palpations: Abdomen is soft.     Tenderness: There is abdominal tenderness.    Musculoskeletal:        General: Normal range of motion.     Cervical back: Normal range of motion.     Right lower leg: No edema.     Left lower leg: No edema.  Skin:    General: Skin is warm and dry.     Capillary Refill: Capillary refill takes less than 2 seconds.  Neurological:     Mental Status: He is alert and oriented to person, place, and time.  Psychiatric:        Mood and Affect: Mood normal.        Behavior: Behavior normal.    ED Results / Procedures / Treatments   Labs (all labs ordered are listed, but only abnormal results are displayed) Labs Reviewed  BASIC METABOLIC PANEL - Abnormal; Notable for the following components:      Result Value   CO2 20 (*)    Glucose, Bld 139 (*)    BUN 27 (*)    Creatinine, Ser 1.77 (*)    GFR, Estimated 41 (*)    All other components within normal limits  CBC - Abnormal; Notable for the following components:   WBC 12.9 (*)    RBC 4.09 (*)    All other components within normal limits    EKG None  Radiology No results found.  Procedures Procedures   Medications Ordered in ED Medications - No data to display  ED Course  I have reviewed the triage vital signs and the nursing notes.  Pertinent labs & imaging results that were available during my care of the  patient were reviewed by me and considered in my medical decision making (see chart for details).    MDM Rules/Calculators/A&P                          CC: urinary retention  This patient complains of urinary retention; this involves an extensive number of treatment options and is a complaint that carries with it a high risk of complications and morbidity. Vital signs were reviewed. Serious etiologies considered.  Record review:   Previous records obtained and reviewed    Work up as above, notable for:   Labs & imaging results that were available during my care of the patient were reviewed by me and considered in my medical decision making.   Renal function stable.  Electrolytes stable.  Approximately 800 cc of urine on bladder scan.  Unable to pass Foley catheter.  Urology to bedside, placed dilator in stable place catheter.  Approx 1.8 L of urine from foley. Pt reports significant improvement discomfort after placement of Foley catheter.  Discussed with urology.  They will follow patient in the office.   The patient improved significantly and was discharged in stable condition. Detailed discussions were had with the patient regarding current findings, and need for close f/u with PCP or on call doctor. The patient has been instructed to return immediately if the symptoms worsen in any way for re-evaluation. Patient verbalized understanding and is in agreement with current care plan. All questions answered prior to discharge.          This chart was dictated using voice recognition software.  Despite best efforts to proofread,  errors can occur which can change the documentation  meaning.  Final Clinical Impression(s) / ED Diagnoses Final diagnoses:  Urinary retention    Rx / DC Orders ED Discharge Orders     None        Jeanell Sparrow, DO 02/08/21 2332

## 2021-02-08 NOTE — ED Notes (Signed)
Dr. Tresa Moore paged to put in catheter.

## 2021-03-05 DIAGNOSIS — R8279 Other abnormal findings on microbiological examination of urine: Secondary | ICD-10-CM | POA: Diagnosis not present

## 2021-03-05 DIAGNOSIS — N35012 Post-traumatic membranous urethral stricture: Secondary | ICD-10-CM | POA: Diagnosis not present

## 2021-03-06 DIAGNOSIS — G629 Polyneuropathy, unspecified: Secondary | ICD-10-CM | POA: Diagnosis not present

## 2021-03-17 DIAGNOSIS — Z1389 Encounter for screening for other disorder: Secondary | ICD-10-CM | POA: Diagnosis not present

## 2021-03-17 DIAGNOSIS — E782 Mixed hyperlipidemia: Secondary | ICD-10-CM | POA: Diagnosis not present

## 2021-03-17 DIAGNOSIS — Z1159 Encounter for screening for other viral diseases: Secondary | ICD-10-CM | POA: Diagnosis not present

## 2021-03-17 DIAGNOSIS — G629 Polyneuropathy, unspecified: Secondary | ICD-10-CM | POA: Diagnosis not present

## 2021-03-17 DIAGNOSIS — Z7984 Long term (current) use of oral hypoglycemic drugs: Secondary | ICD-10-CM | POA: Diagnosis not present

## 2021-03-17 DIAGNOSIS — E1121 Type 2 diabetes mellitus with diabetic nephropathy: Secondary | ICD-10-CM | POA: Diagnosis not present

## 2021-03-17 DIAGNOSIS — Z0000 Encounter for general adult medical examination without abnormal findings: Secondary | ICD-10-CM | POA: Diagnosis not present

## 2021-03-17 DIAGNOSIS — E559 Vitamin D deficiency, unspecified: Secondary | ICD-10-CM | POA: Diagnosis not present

## 2021-03-17 DIAGNOSIS — N5201 Erectile dysfunction due to arterial insufficiency: Secondary | ICD-10-CM | POA: Diagnosis not present

## 2021-03-17 DIAGNOSIS — K5904 Chronic idiopathic constipation: Secondary | ICD-10-CM | POA: Diagnosis not present

## 2021-03-17 DIAGNOSIS — N1831 Chronic kidney disease, stage 3a: Secondary | ICD-10-CM | POA: Diagnosis not present

## 2021-03-17 DIAGNOSIS — C61 Malignant neoplasm of prostate: Secondary | ICD-10-CM | POA: Diagnosis not present

## 2021-03-17 DIAGNOSIS — N35012 Post-traumatic membranous urethral stricture: Secondary | ICD-10-CM | POA: Diagnosis not present

## 2021-03-17 DIAGNOSIS — K219 Gastro-esophageal reflux disease without esophagitis: Secondary | ICD-10-CM | POA: Diagnosis not present

## 2021-03-30 DIAGNOSIS — B078 Other viral warts: Secondary | ICD-10-CM | POA: Diagnosis not present

## 2021-03-30 DIAGNOSIS — M7061 Trochanteric bursitis, right hip: Secondary | ICD-10-CM | POA: Diagnosis not present

## 2021-03-31 IMAGING — US US PELVIS LIMITED
1 series · 14 of 25 positions shown · non-contrast
Comparison: MRI pelvis dated 12/02/2017

CLINICAL DATA: Inguinal mass.

EXAM:
LIMITED ULTRASOUND OF PELVIS
TECHNIQUE: Limited transabdominal ultrasound examination of the pelvis was
performed.

[Series 1: us pelvis limited · 0.05mm/px · 27 acquisitions, 14 frames shown]
[im 1/27]
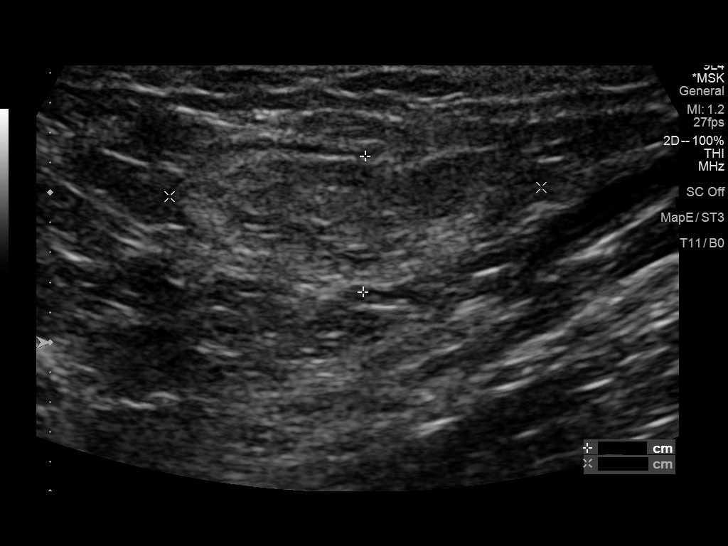
[im 3/27]
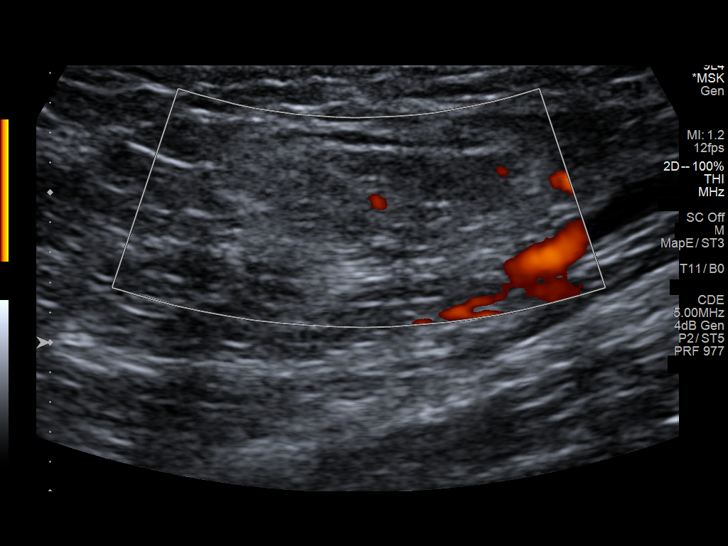
[im 5/27]
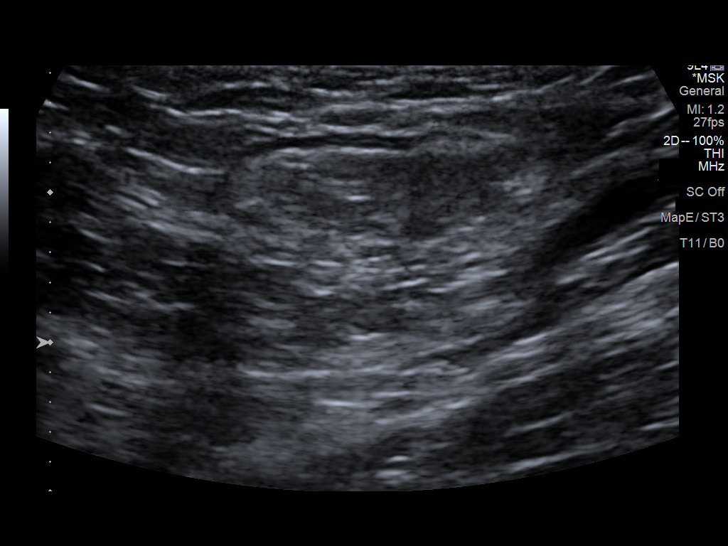
[im 7/27]
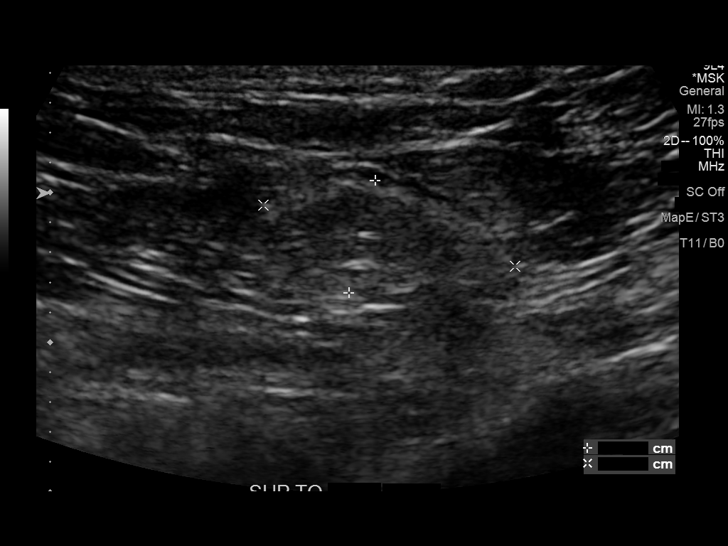
[im 9/27]
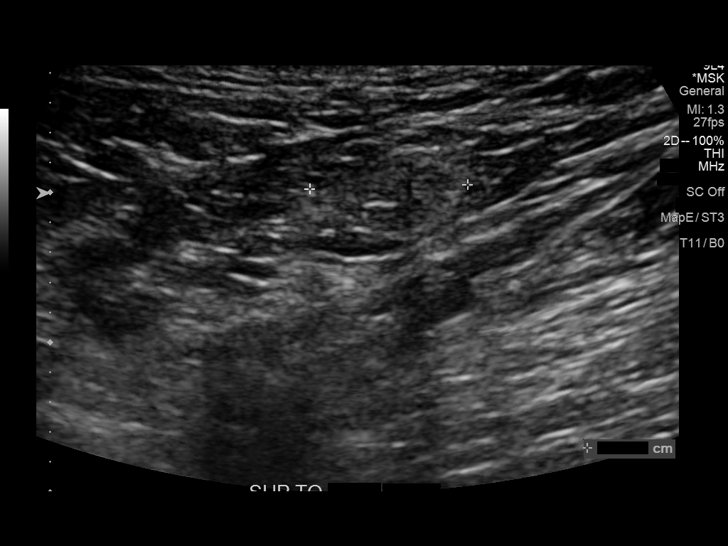
[im 10/27]
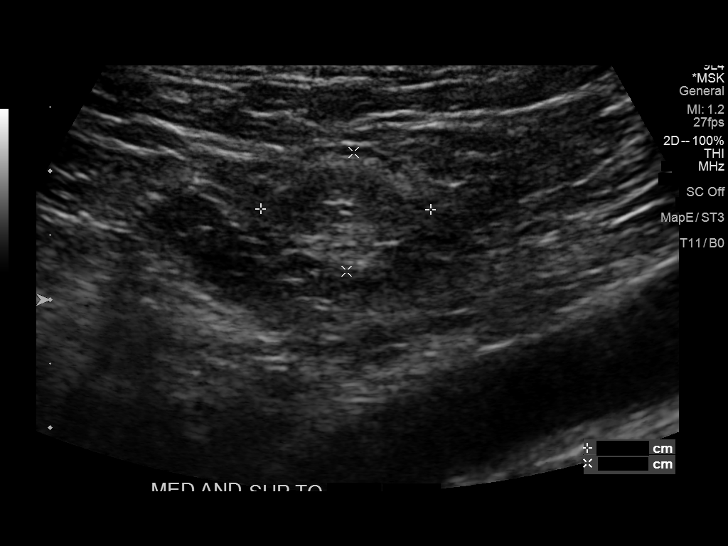
[im 12/27]
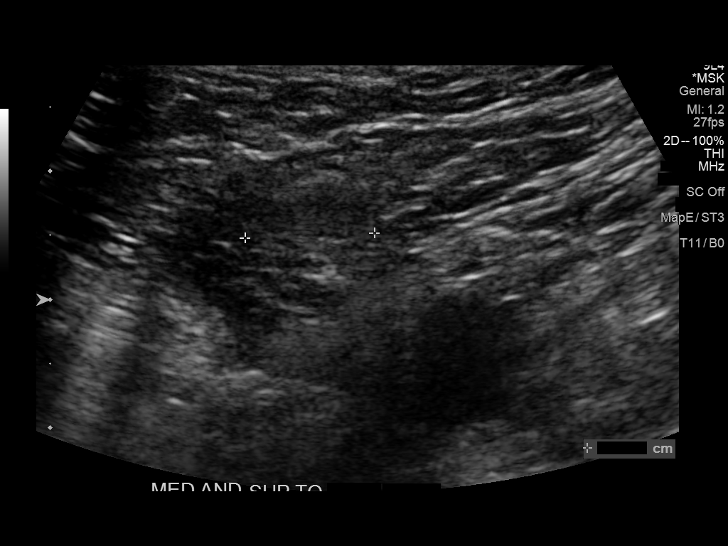
[im 15/27]
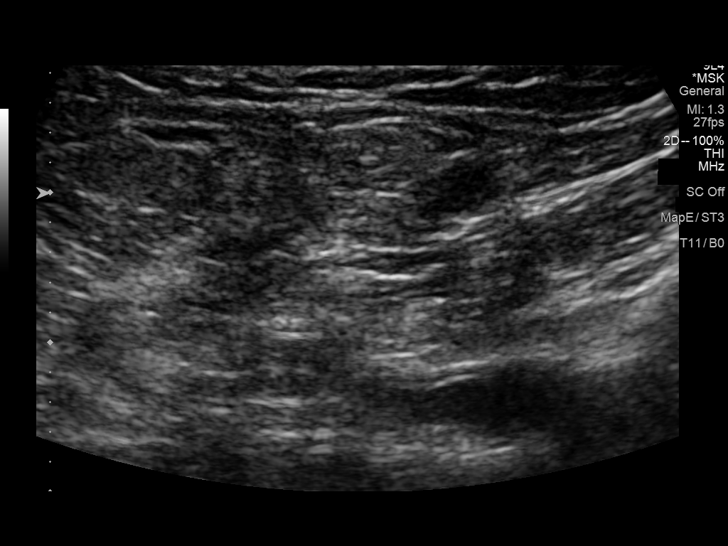
[im 17/27]
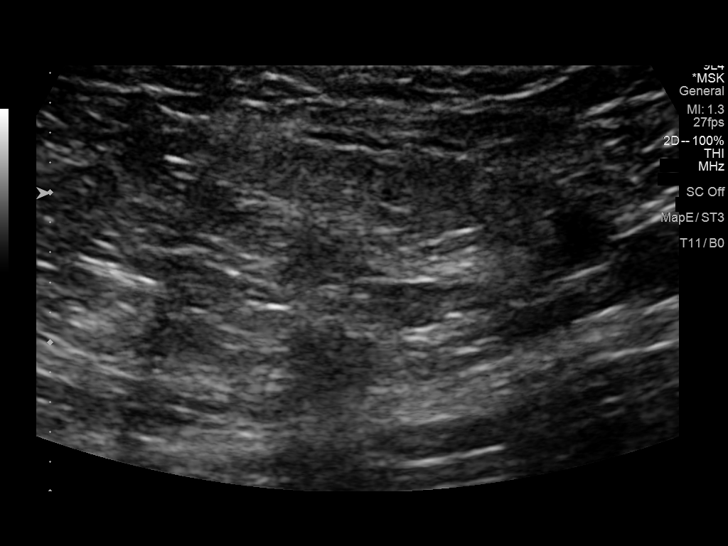
[im 18/27]
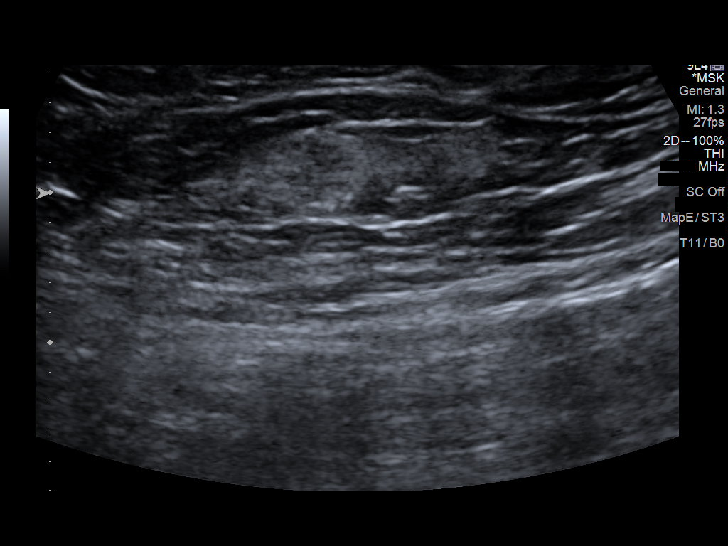
[im 20/27]
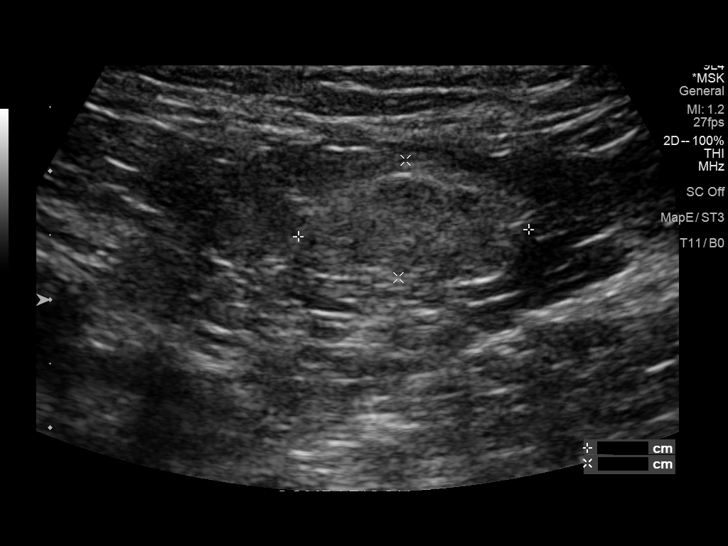
[im 22/27]
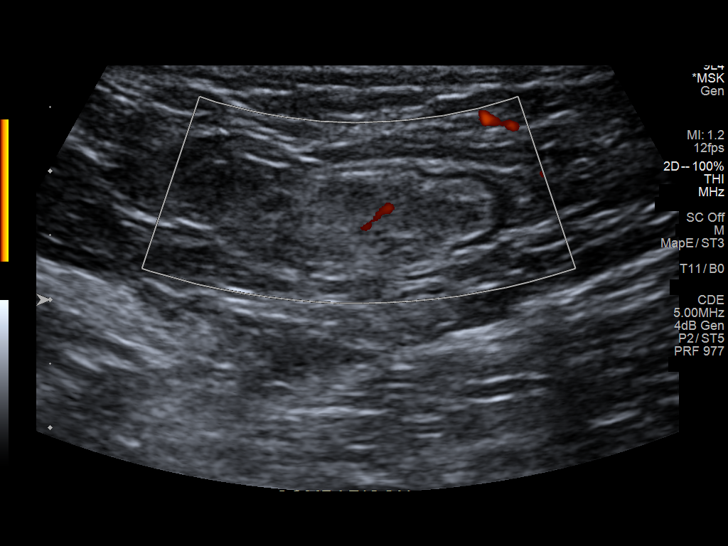
[im 24/27]
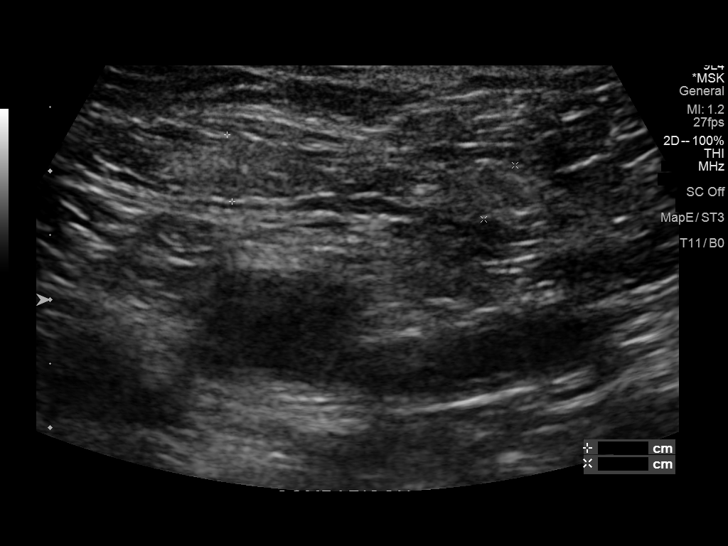
[im 27/27]
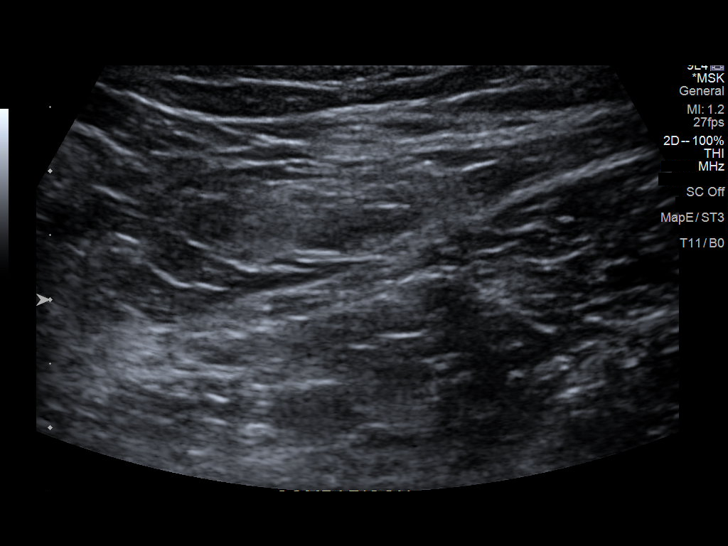

[14 of 25 positions shown; findings below may reference images not displayed]

FINDINGS: The patient's palpable area of concern corresponds to a 0.9 x 2.5 x
1.4 cm somewhat ill-defined, slightly hyperechoic mass. This mass is
oblong in shape and demonstrates minimal internal color Doppler
flow. Additional echogenic oblong soft tissue masses are noted.
There is no drainable fluid collection.
IMPRESSION: The patient's palpable area of concern in the left inguinal region
corresponds to several inguinal lymph nodes that appear to be
morphologically normal. These are favored to be reactive in
etiology. In the absence of interval growth, no further follow-up is
recommended.

## 2021-04-08 DIAGNOSIS — E1165 Type 2 diabetes mellitus with hyperglycemia: Secondary | ICD-10-CM | POA: Diagnosis not present

## 2021-04-08 DIAGNOSIS — K5900 Constipation, unspecified: Secondary | ICD-10-CM | POA: Diagnosis not present

## 2021-04-08 DIAGNOSIS — G40909 Epilepsy, unspecified, not intractable, without status epilepticus: Secondary | ICD-10-CM | POA: Diagnosis not present

## 2021-04-08 DIAGNOSIS — N529 Male erectile dysfunction, unspecified: Secondary | ICD-10-CM | POA: Diagnosis not present

## 2021-04-08 DIAGNOSIS — M1990 Unspecified osteoarthritis, unspecified site: Secondary | ICD-10-CM | POA: Diagnosis not present

## 2021-04-08 DIAGNOSIS — E785 Hyperlipidemia, unspecified: Secondary | ICD-10-CM | POA: Diagnosis not present

## 2021-04-08 DIAGNOSIS — K219 Gastro-esophageal reflux disease without esophagitis: Secondary | ICD-10-CM | POA: Diagnosis not present

## 2021-04-08 DIAGNOSIS — R030 Elevated blood-pressure reading, without diagnosis of hypertension: Secondary | ICD-10-CM | POA: Diagnosis not present

## 2021-04-08 DIAGNOSIS — N400 Enlarged prostate without lower urinary tract symptoms: Secondary | ICD-10-CM | POA: Diagnosis not present

## 2021-04-23 DIAGNOSIS — M79675 Pain in left toe(s): Secondary | ICD-10-CM | POA: Diagnosis not present

## 2021-04-23 DIAGNOSIS — M722 Plantar fascial fibromatosis: Secondary | ICD-10-CM | POA: Diagnosis not present

## 2021-04-23 DIAGNOSIS — M1909 Primary osteoarthritis, other specified site: Secondary | ICD-10-CM | POA: Diagnosis not present

## 2021-04-23 DIAGNOSIS — M79674 Pain in right toe(s): Secondary | ICD-10-CM | POA: Diagnosis not present

## 2021-05-07 DIAGNOSIS — M1611 Unilateral primary osteoarthritis, right hip: Secondary | ICD-10-CM | POA: Diagnosis not present

## 2021-05-07 DIAGNOSIS — M7061 Trochanteric bursitis, right hip: Secondary | ICD-10-CM | POA: Diagnosis not present

## 2021-05-07 DIAGNOSIS — M25551 Pain in right hip: Secondary | ICD-10-CM | POA: Diagnosis not present

## 2021-05-09 DIAGNOSIS — E118 Type 2 diabetes mellitus with unspecified complications: Secondary | ICD-10-CM | POA: Diagnosis not present

## 2021-05-11 DIAGNOSIS — H52223 Regular astigmatism, bilateral: Secondary | ICD-10-CM | POA: Diagnosis not present

## 2021-05-11 DIAGNOSIS — H35361 Drusen (degenerative) of macula, right eye: Secondary | ICD-10-CM | POA: Diagnosis not present

## 2021-05-11 DIAGNOSIS — H43821 Vitreomacular adhesion, right eye: Secondary | ICD-10-CM | POA: Diagnosis not present

## 2021-05-11 DIAGNOSIS — H25813 Combined forms of age-related cataract, bilateral: Secondary | ICD-10-CM | POA: Diagnosis not present

## 2021-05-11 DIAGNOSIS — E119 Type 2 diabetes mellitus without complications: Secondary | ICD-10-CM | POA: Diagnosis not present

## 2021-05-15 DIAGNOSIS — M25551 Pain in right hip: Secondary | ICD-10-CM | POA: Diagnosis not present

## 2021-05-21 DIAGNOSIS — M25551 Pain in right hip: Secondary | ICD-10-CM | POA: Diagnosis not present

## 2021-05-21 DIAGNOSIS — M1611 Unilateral primary osteoarthritis, right hip: Secondary | ICD-10-CM | POA: Diagnosis not present

## 2021-05-21 DIAGNOSIS — M7061 Trochanteric bursitis, right hip: Secondary | ICD-10-CM | POA: Diagnosis not present

## 2021-05-30 DIAGNOSIS — E118 Type 2 diabetes mellitus with unspecified complications: Secondary | ICD-10-CM | POA: Diagnosis not present

## 2021-06-15 DIAGNOSIS — M76822 Posterior tibial tendinitis, left leg: Secondary | ICD-10-CM | POA: Diagnosis not present

## 2021-06-15 DIAGNOSIS — M76821 Posterior tibial tendinitis, right leg: Secondary | ICD-10-CM | POA: Diagnosis not present

## 2021-06-15 DIAGNOSIS — M722 Plantar fascial fibromatosis: Secondary | ICD-10-CM | POA: Diagnosis not present

## 2021-06-15 DIAGNOSIS — M7731 Calcaneal spur, right foot: Secondary | ICD-10-CM | POA: Diagnosis not present

## 2021-06-15 DIAGNOSIS — E118 Type 2 diabetes mellitus with unspecified complications: Secondary | ICD-10-CM | POA: Diagnosis not present

## 2021-06-15 DIAGNOSIS — M7732 Calcaneal spur, left foot: Secondary | ICD-10-CM | POA: Diagnosis not present

## 2021-06-15 DIAGNOSIS — E1142 Type 2 diabetes mellitus with diabetic polyneuropathy: Secondary | ICD-10-CM | POA: Diagnosis not present

## 2021-06-17 DIAGNOSIS — R0981 Nasal congestion: Secondary | ICD-10-CM | POA: Diagnosis not present

## 2021-06-17 DIAGNOSIS — Z20822 Contact with and (suspected) exposure to covid-19: Secondary | ICD-10-CM | POA: Diagnosis not present

## 2021-06-22 DIAGNOSIS — Z1283 Encounter for screening for malignant neoplasm of skin: Secondary | ICD-10-CM | POA: Diagnosis not present

## 2021-06-22 DIAGNOSIS — L821 Other seborrheic keratosis: Secondary | ICD-10-CM | POA: Diagnosis not present

## 2021-06-29 DIAGNOSIS — N1831 Chronic kidney disease, stage 3a: Secondary | ICD-10-CM | POA: Diagnosis not present

## 2021-06-29 DIAGNOSIS — E118 Type 2 diabetes mellitus with unspecified complications: Secondary | ICD-10-CM | POA: Diagnosis not present

## 2021-07-07 DIAGNOSIS — N1831 Chronic kidney disease, stage 3a: Secondary | ICD-10-CM | POA: Diagnosis not present

## 2021-07-07 DIAGNOSIS — N390 Urinary tract infection, site not specified: Secondary | ICD-10-CM | POA: Diagnosis not present

## 2021-07-07 DIAGNOSIS — N2581 Secondary hyperparathyroidism of renal origin: Secondary | ICD-10-CM | POA: Diagnosis not present

## 2021-07-07 DIAGNOSIS — E1122 Type 2 diabetes mellitus with diabetic chronic kidney disease: Secondary | ICD-10-CM | POA: Diagnosis not present

## 2021-07-07 DIAGNOSIS — D631 Anemia in chronic kidney disease: Secondary | ICD-10-CM | POA: Diagnosis not present

## 2021-07-14 DIAGNOSIS — E118 Type 2 diabetes mellitus with unspecified complications: Secondary | ICD-10-CM | POA: Diagnosis not present

## 2021-07-22 DIAGNOSIS — K5904 Chronic idiopathic constipation: Secondary | ICD-10-CM | POA: Diagnosis not present

## 2021-07-22 DIAGNOSIS — N1831 Chronic kidney disease, stage 3a: Secondary | ICD-10-CM | POA: Diagnosis not present

## 2021-07-22 DIAGNOSIS — E1121 Type 2 diabetes mellitus with diabetic nephropathy: Secondary | ICD-10-CM | POA: Diagnosis not present

## 2021-07-22 DIAGNOSIS — R8290 Unspecified abnormal findings in urine: Secondary | ICD-10-CM | POA: Diagnosis not present

## 2021-07-22 DIAGNOSIS — G629 Polyneuropathy, unspecified: Secondary | ICD-10-CM | POA: Diagnosis not present

## 2021-07-29 DIAGNOSIS — E118 Type 2 diabetes mellitus with unspecified complications: Secondary | ICD-10-CM | POA: Diagnosis not present

## 2021-08-03 DIAGNOSIS — N1831 Chronic kidney disease, stage 3a: Secondary | ICD-10-CM | POA: Diagnosis not present

## 2021-08-03 DIAGNOSIS — N390 Urinary tract infection, site not specified: Secondary | ICD-10-CM | POA: Diagnosis not present

## 2021-08-05 DIAGNOSIS — E118 Type 2 diabetes mellitus with unspecified complications: Secondary | ICD-10-CM | POA: Diagnosis not present

## 2021-08-10 DIAGNOSIS — C61 Malignant neoplasm of prostate: Secondary | ICD-10-CM | POA: Diagnosis not present

## 2021-08-11 DIAGNOSIS — R5381 Other malaise: Secondary | ICD-10-CM | POA: Diagnosis not present

## 2021-08-11 DIAGNOSIS — Z20822 Contact with and (suspected) exposure to covid-19: Secondary | ICD-10-CM | POA: Diagnosis not present

## 2021-08-11 DIAGNOSIS — R058 Other specified cough: Secondary | ICD-10-CM | POA: Diagnosis not present

## 2021-09-02 DIAGNOSIS — E785 Hyperlipidemia, unspecified: Secondary | ICD-10-CM | POA: Diagnosis not present

## 2021-09-02 DIAGNOSIS — E119 Type 2 diabetes mellitus without complications: Secondary | ICD-10-CM | POA: Diagnosis not present

## 2021-09-02 DIAGNOSIS — N183 Chronic kidney disease, stage 3 unspecified: Secondary | ICD-10-CM | POA: Diagnosis not present

## 2021-09-03 DIAGNOSIS — M7061 Trochanteric bursitis, right hip: Secondary | ICD-10-CM | POA: Diagnosis not present

## 2021-09-03 DIAGNOSIS — M1711 Unilateral primary osteoarthritis, right knee: Secondary | ICD-10-CM | POA: Diagnosis not present

## 2021-09-03 DIAGNOSIS — M1712 Unilateral primary osteoarthritis, left knee: Secondary | ICD-10-CM | POA: Diagnosis not present

## 2021-09-03 DIAGNOSIS — M722 Plantar fascial fibromatosis: Secondary | ICD-10-CM | POA: Diagnosis not present

## 2021-09-15 DIAGNOSIS — C61 Malignant neoplasm of prostate: Secondary | ICD-10-CM | POA: Diagnosis not present

## 2021-09-15 DIAGNOSIS — E1121 Type 2 diabetes mellitus with diabetic nephropathy: Secondary | ICD-10-CM | POA: Diagnosis not present

## 2021-09-15 DIAGNOSIS — G629 Polyneuropathy, unspecified: Secondary | ICD-10-CM | POA: Diagnosis not present

## 2021-09-15 DIAGNOSIS — N35012 Post-traumatic membranous urethral stricture: Secondary | ICD-10-CM | POA: Diagnosis not present

## 2021-09-15 DIAGNOSIS — E559 Vitamin D deficiency, unspecified: Secondary | ICD-10-CM | POA: Diagnosis not present

## 2021-09-15 DIAGNOSIS — G40909 Epilepsy, unspecified, not intractable, without status epilepticus: Secondary | ICD-10-CM | POA: Diagnosis not present

## 2021-09-15 DIAGNOSIS — K219 Gastro-esophageal reflux disease without esophagitis: Secondary | ICD-10-CM | POA: Diagnosis not present

## 2021-09-15 DIAGNOSIS — N1831 Chronic kidney disease, stage 3a: Secondary | ICD-10-CM | POA: Diagnosis not present

## 2021-09-15 DIAGNOSIS — N529 Male erectile dysfunction, unspecified: Secondary | ICD-10-CM | POA: Diagnosis not present

## 2021-09-15 DIAGNOSIS — E782 Mixed hyperlipidemia: Secondary | ICD-10-CM | POA: Diagnosis not present

## 2021-09-15 DIAGNOSIS — Z8546 Personal history of malignant neoplasm of prostate: Secondary | ICD-10-CM | POA: Diagnosis not present

## 2021-09-15 DIAGNOSIS — N5201 Erectile dysfunction due to arterial insufficiency: Secondary | ICD-10-CM | POA: Diagnosis not present

## 2021-09-16 DIAGNOSIS — E119 Type 2 diabetes mellitus without complications: Secondary | ICD-10-CM | POA: Diagnosis not present

## 2021-09-16 DIAGNOSIS — E785 Hyperlipidemia, unspecified: Secondary | ICD-10-CM | POA: Diagnosis not present

## 2021-09-16 DIAGNOSIS — N183 Chronic kidney disease, stage 3 unspecified: Secondary | ICD-10-CM | POA: Diagnosis not present

## 2021-10-01 ENCOUNTER — Other Ambulatory Visit: Payer: Self-pay | Admitting: Orthopedic Surgery

## 2021-10-05 DIAGNOSIS — M1611 Unilateral primary osteoarthritis, right hip: Secondary | ICD-10-CM | POA: Diagnosis not present

## 2021-10-05 DIAGNOSIS — M25651 Stiffness of right hip, not elsewhere classified: Secondary | ICD-10-CM | POA: Diagnosis not present

## 2021-10-05 DIAGNOSIS — M25551 Pain in right hip: Secondary | ICD-10-CM | POA: Diagnosis not present

## 2021-10-05 DIAGNOSIS — M160 Bilateral primary osteoarthritis of hip: Secondary | ICD-10-CM | POA: Diagnosis not present

## 2021-10-05 DIAGNOSIS — R262 Difficulty in walking, not elsewhere classified: Secondary | ICD-10-CM | POA: Diagnosis not present

## 2021-10-08 DIAGNOSIS — E1165 Type 2 diabetes mellitus with hyperglycemia: Secondary | ICD-10-CM | POA: Diagnosis not present

## 2021-10-08 DIAGNOSIS — Z01818 Encounter for other preprocedural examination: Secondary | ICD-10-CM | POA: Diagnosis not present

## 2021-10-12 ENCOUNTER — Ambulatory Visit (HOSPITAL_COMMUNITY)
Admission: RE | Admit: 2021-10-12 | Discharge: 2021-10-12 | Disposition: A | Discharge status: Home / Self Care | Payer: Medicare PPO | Source: Ambulatory Visit | Attending: Orthopedic Surgery | Admitting: Orthopedic Surgery

## 2021-10-12 ENCOUNTER — Encounter (HOSPITAL_COMMUNITY)
Admission: RE | Admit: 2021-10-12 | Discharge: 2021-10-12 | Disposition: A | Discharge status: Home / Self Care | Payer: Medicare PPO | Source: Ambulatory Visit | Attending: Orthopedic Surgery | Admitting: Orthopedic Surgery

## 2021-10-12 ENCOUNTER — Encounter (HOSPITAL_COMMUNITY): Payer: Self-pay

## 2021-10-12 ENCOUNTER — Other Ambulatory Visit: Payer: Self-pay

## 2021-10-12 DIAGNOSIS — Z01818 Encounter for other preprocedural examination: Secondary | ICD-10-CM | POA: Insufficient documentation

## 2021-10-12 DIAGNOSIS — M419 Scoliosis, unspecified: Secondary | ICD-10-CM | POA: Diagnosis not present

## 2021-10-12 DIAGNOSIS — E119 Type 2 diabetes mellitus without complications: Secondary | ICD-10-CM | POA: Insufficient documentation

## 2021-10-12 DIAGNOSIS — M47814 Spondylosis without myelopathy or radiculopathy, thoracic region: Secondary | ICD-10-CM | POA: Diagnosis not present

## 2021-10-12 DIAGNOSIS — J984 Other disorders of lung: Secondary | ICD-10-CM | POA: Diagnosis not present

## 2021-10-12 LAB — SURGICAL PCR SCREEN
MRSA, PCR: NEGATIVE
Staphylococcus aureus: NEGATIVE

## 2021-10-12 LAB — BASIC METABOLIC PANEL
Anion gap: 8 (ref 5–15)
BUN: 32 mg/dL — ABNORMAL HIGH (ref 8–23)
CO2: 24 mmol/L (ref 22–32)
Calcium: 9.5 mg/dL (ref 8.9–10.3)
Chloride: 105 mmol/L (ref 98–111)
Creatinine, Ser: 1.78 mg/dL — ABNORMAL HIGH (ref 0.61–1.24)
GFR, Estimated: 41 mL/min — ABNORMAL LOW (ref >60)
Glucose, Bld: 89 mg/dL (ref 70–99)
Potassium: 4.4 mmol/L (ref 3.5–5.1)
Sodium: 137 mmol/L (ref 135–145)

## 2021-10-12 LAB — CBC
HCT: 46.9 % (ref 39.0–52.0)
Hemoglobin: 16.1 g/dL (ref 13.0–17.0)
MCH: 33.4 pg (ref 26.0–34.0)
MCHC: 34.3 g/dL (ref 30.0–36.0)
MCV: 97.3 fL (ref 80.0–100.0)
Platelets: 236 10*3/uL (ref 150–400)
RBC: 4.82 MIL/uL (ref 4.22–5.81)
RDW: 11.5 % (ref 11.5–15.5)
WBC: 7.2 10*3/uL (ref 4.0–10.5)
nRBC: 0 % (ref 0.0–0.2)

## 2021-10-12 LAB — GLUCOSE, CAPILLARY: Glucose-Capillary: 107 mg/dL — ABNORMAL HIGH (ref 70–99)

## 2021-10-13 LAB — HEMOGLOBIN A1C
Hgb A1c MFr Bld: 6.1 % — ABNORMAL HIGH (ref 4.8–5.6)
Mean Plasma Glucose: 128 mg/dL

## 2021-10-16 DIAGNOSIS — E785 Hyperlipidemia, unspecified: Secondary | ICD-10-CM | POA: Diagnosis not present

## 2021-10-16 DIAGNOSIS — N183 Chronic kidney disease, stage 3 unspecified: Secondary | ICD-10-CM | POA: Diagnosis not present

## 2021-10-16 DIAGNOSIS — E119 Type 2 diabetes mellitus without complications: Secondary | ICD-10-CM | POA: Diagnosis not present

## 2021-10-17 NOTE — Anesthesia Preprocedure Evaluation (Signed)
Anesthesia Evaluation  Patient identified by MRN, date of birth, ID band Patient awake    Reviewed: Allergy & Precautions, H&P , NPO status , Patient's Chart, lab work & pertinent test results, Unable to perform ROS - Chart review only  History of Anesthesia Complications (+) Family history of anesthesia reaction  Airway Mallampati: II  TM Distance: >3 FB Neck ROM: full    Dental no notable dental hx.    Pulmonary former smoker,    Pulmonary exam normal breath sounds clear to auscultation       Cardiovascular Exercise Tolerance: Good Normal cardiovascular exam Rhythm:Regular Rate:Normal     Neuro/Psych Seizures -,     GI/Hepatic GERD  ,  Endo/Other  diabetes  Renal/GU Renal disease     Musculoskeletal  (+) Arthritis ,   Abdominal   Peds  Hematology   Anesthesia Other Findings   Reproductive/Obstetrics                            Anesthesia Physical  Anesthesia Plan  ASA: 3  Anesthesia Plan: Spinal   Post-op Pain Management:  Regional for Post-op pain   Induction: Intravenous  PONV Risk Score and Plan: Ondansetron, Dexamethasone, Treatment may vary due to age or medical condition, Midazolam and Propofol infusion  Airway Management Planned: Natural Airway and Simple Face Mask  Additional Equipment: None  Intra-op Plan:   Post-operative Plan:   Informed Consent: I have reviewed the patients History and Physical, chart, labs and discussed the procedure including the risks, benefits and alternatives for the proposed anesthesia with the patient or authorized representative who has indicated his/her understanding and acceptance.     Dental advisory given  Plan Discussed with: CRNA, Anesthesiologist and Surgeon  Anesthesia Plan Comments: (Spinal. GA/LMA as backup plan. Norton Blizzard, MD  )       Anesthesia Quick Evaluation

## 2021-10-19 ENCOUNTER — Other Ambulatory Visit: Payer: Self-pay

## 2021-10-19 ENCOUNTER — Encounter (HOSPITAL_COMMUNITY): Payer: Self-pay | Admitting: Orthopedic Surgery

## 2021-10-19 ENCOUNTER — Encounter (HOSPITAL_COMMUNITY)
Admission: RE | Disposition: A | Discharge status: Home / Self Care | Payer: Self-pay | Source: Home / Self Care | Attending: Orthopedic Surgery

## 2021-10-19 ENCOUNTER — Observation Stay (HOSPITAL_COMMUNITY)
Admission: RE | Admit: 2021-10-19 | Discharge: 2021-10-20 | Disposition: A | Discharge status: Home / Self Care | Payer: Medicare PPO | Attending: Orthopedic Surgery | Admitting: Orthopedic Surgery

## 2021-10-19 ENCOUNTER — Ambulatory Visit (HOSPITAL_COMMUNITY): Payer: Medicare PPO

## 2021-10-19 ENCOUNTER — Ambulatory Visit (HOSPITAL_COMMUNITY): Payer: Medicare PPO | Admitting: Physician Assistant

## 2021-10-19 ENCOUNTER — Ambulatory Visit (HOSPITAL_BASED_OUTPATIENT_CLINIC_OR_DEPARTMENT_OTHER): Payer: Medicare PPO | Admitting: Certified Registered Nurse Anesthetist

## 2021-10-19 DIAGNOSIS — Z8546 Personal history of malignant neoplasm of prostate: Secondary | ICD-10-CM | POA: Diagnosis not present

## 2021-10-19 DIAGNOSIS — Z96641 Presence of right artificial hip joint: Secondary | ICD-10-CM | POA: Diagnosis present

## 2021-10-19 DIAGNOSIS — M1611 Unilateral primary osteoarthritis, right hip: Secondary | ICD-10-CM | POA: Diagnosis not present

## 2021-10-19 DIAGNOSIS — Z87891 Personal history of nicotine dependence: Secondary | ICD-10-CM

## 2021-10-19 DIAGNOSIS — Z471 Aftercare following joint replacement surgery: Secondary | ICD-10-CM | POA: Diagnosis not present

## 2021-10-19 DIAGNOSIS — E1122 Type 2 diabetes mellitus with diabetic chronic kidney disease: Secondary | ICD-10-CM | POA: Diagnosis not present

## 2021-10-19 DIAGNOSIS — Z01818 Encounter for other preprocedural examination: Secondary | ICD-10-CM

## 2021-10-19 DIAGNOSIS — Z7984 Long term (current) use of oral hypoglycemic drugs: Secondary | ICD-10-CM

## 2021-10-19 DIAGNOSIS — E119 Type 2 diabetes mellitus without complications: Secondary | ICD-10-CM | POA: Diagnosis not present

## 2021-10-19 DIAGNOSIS — N189 Chronic kidney disease, unspecified: Secondary | ICD-10-CM | POA: Diagnosis not present

## 2021-10-19 DIAGNOSIS — N289 Disorder of kidney and ureter, unspecified: Secondary | ICD-10-CM | POA: Diagnosis not present

## 2021-10-19 HISTORY — PX: TOTAL HIP ARTHROPLASTY: SHX124

## 2021-10-19 LAB — GLUCOSE, CAPILLARY
Glucose-Capillary: 155 mg/dL — ABNORMAL HIGH (ref 70–99)
Glucose-Capillary: 167 mg/dL — ABNORMAL HIGH (ref 70–99)

## 2021-10-19 LAB — ABO/RH: ABO/RH(D): O NEG

## 2021-10-19 LAB — TYPE AND SCREEN
ABO/RH(D): O NEG
Antibody Screen: NEGATIVE

## 2021-10-19 SURGERY — ARTHROPLASTY, HIP, TOTAL, ANTERIOR APPROACH
Anesthesia: Spinal | Site: Hip | Laterality: Right

## 2021-10-19 MED ORDER — INSULIN ASPART 100 UNIT/ML IJ SOLN: 0.0000 [IU] | Freq: Three times a day (TID) | INTRAMUSCULAR | Status: DC

## 2021-10-19 MED ORDER — METHOCARBAMOL 500 MG PO TABS
500.0000 mg | ORAL_TABLET | Freq: Four times a day (QID) | ORAL | Status: DC | PRN
  Administered 2021-10-19 – 2021-10-20 (×3): 500 mg via ORAL
  Filled 2021-10-19 (×3): qty 1

## 2021-10-19 MED ORDER — ACETAMINOPHEN 325 MG PO TABS
325.0000 mg | ORAL_TABLET | Freq: Four times a day (QID) | ORAL | Status: DC | PRN
  Administered 2021-10-20: 650 mg via ORAL
  Filled 2021-10-19: qty 2

## 2021-10-19 MED ORDER — DIPHENHYDRAMINE HCL 12.5 MG/5ML PO ELIX
12.5000 mg | ORAL_SOLUTION | ORAL | Status: DC | PRN
  Filled 2021-10-19: qty 10

## 2021-10-19 MED ORDER — METFORMIN HCL 500 MG PO TABS
500.0000 mg | ORAL_TABLET | Freq: Two times a day (BID) | ORAL | Status: DC
Start: 2021-10-20 — End: 2021-10-20
  Administered 2021-10-20: 500 mg via ORAL
  Filled 2021-10-19: qty 1

## 2021-10-19 MED ORDER — BUPIVACAINE IN DEXTROSE 0.75-8.25 % IT SOLN
INTRATHECAL | Status: DC | PRN
  Administered 2021-10-19: 1.9 mL via INTRATHECAL

## 2021-10-19 MED ORDER — ORAL CARE MOUTH RINSE: 15.0000 mL | Freq: Once | OROMUCOSAL | Status: AC

## 2021-10-19 MED ORDER — DEXAMETHASONE SODIUM PHOSPHATE 10 MG/ML IJ SOLN
INTRAMUSCULAR | Status: AC
  Filled 2021-10-19: qty 1

## 2021-10-19 MED ORDER — PHENOL 1.4 % MT LIQD: 1.0000 | OROMUCOSAL | Status: DC | PRN

## 2021-10-19 MED ORDER — SODIUM CHLORIDE 0.9 % IR SOLN
Status: DC | PRN
  Administered 2021-10-19: 1000 mL

## 2021-10-19 MED ORDER — TRANEXAMIC ACID-NACL 1000-0.7 MG/100ML-% IV SOLN
1000.0000 mg | Freq: Once | INTRAVENOUS | Status: AC
  Administered 2021-10-19: 1000 mg via INTRAVENOUS

## 2021-10-19 MED ORDER — CEFAZOLIN SODIUM-DEXTROSE 2-4 GM/100ML-% IV SOLN
2.0000 g | Freq: Four times a day (QID) | INTRAVENOUS | Status: AC
  Administered 2021-10-19: 2 g via INTRAVENOUS

## 2021-10-19 MED ORDER — OXYCODONE HCL 5 MG PO TABS
ORAL_TABLET | ORAL | Status: AC
  Filled 2021-10-19: qty 1

## 2021-10-19 MED ORDER — PHENYLEPHRINE HCL-NACL 20-0.9 MG/250ML-% IV SOLN
INTRAVENOUS | Status: AC
  Filled 2021-10-19: qty 500

## 2021-10-19 MED ORDER — ONDANSETRON HCL 4 MG/2ML IJ SOLN
INTRAMUSCULAR | Status: DC | PRN
  Administered 2021-10-19: 4 mg via INTRAVENOUS

## 2021-10-19 MED ORDER — ONDANSETRON HCL 4 MG PO TABS: 4.0000 mg | ORAL_TABLET | Freq: Four times a day (QID) | ORAL | Status: DC | PRN

## 2021-10-19 MED ORDER — BUPIVACAINE-EPINEPHRINE 0.5% -1:200000 IJ SOLN
INTRAMUSCULAR | Status: AC
Start: 2021-10-19 — End: ?
  Filled 2021-10-19: qty 1

## 2021-10-19 MED ORDER — ALUM & MAG HYDROXIDE-SIMETH 200-200-20 MG/5ML PO SUSP: 30.0000 mL | ORAL | Status: DC | PRN

## 2021-10-19 MED ORDER — POVIDONE-IODINE 10 % EX SWAB: 2.0000 | Freq: Once | CUTANEOUS | Status: DC

## 2021-10-19 MED ORDER — CEFAZOLIN SODIUM-DEXTROSE 2-4 GM/100ML-% IV SOLN
2.0000 g | INTRAVENOUS | Status: AC
  Administered 2021-10-19: 2 g via INTRAVENOUS
  Filled 2021-10-19: qty 100

## 2021-10-19 MED ORDER — MENTHOL 3 MG MT LOZG: 1.0000 | LOZENGE | OROMUCOSAL | Status: DC | PRN

## 2021-10-19 MED ORDER — SIMVASTATIN 40 MG PO TABS
40.0000 mg | ORAL_TABLET | Freq: Every day | ORAL | Status: DC
  Administered 2021-10-19: 40 mg via ORAL
  Filled 2021-10-19: qty 1

## 2021-10-19 MED ORDER — METHOCARBAMOL 500 MG PO TABS
ORAL_TABLET | ORAL | Status: AC
  Filled 2021-10-19: qty 1

## 2021-10-19 MED ORDER — SODIUM CHLORIDE (PF) 0.9 % IJ SOLN
INTRAMUSCULAR | Status: DC | PRN
  Administered 2021-10-19: 30 mL

## 2021-10-19 MED ORDER — DOCUSATE SODIUM 100 MG PO CAPS
100.0000 mg | ORAL_CAPSULE | Freq: Two times a day (BID) | ORAL | Status: DC
  Administered 2021-10-19 – 2021-10-20 (×2): 100 mg via ORAL
  Filled 2021-10-19 (×2): qty 1

## 2021-10-19 MED ORDER — CHLORHEXIDINE GLUCONATE 0.12 % MT SOLN
15.0000 mL | Freq: Once | OROMUCOSAL | Status: AC
  Administered 2021-10-19: 15 mL via OROMUCOSAL

## 2021-10-19 MED ORDER — OXYCODONE-ACETAMINOPHEN 5-325 MG PO TABS: 1.0000 | ORAL_TABLET | Freq: Four times a day (QID) | ORAL | 0 refills | Status: DC | PRN

## 2021-10-19 MED ORDER — ASPIRIN 325 MG PO TBEC: 325.0000 mg | DELAYED_RELEASE_TABLET | Freq: Two times a day (BID) | ORAL | 0 refills | Status: DC

## 2021-10-19 MED ORDER — TIZANIDINE HCL 2 MG PO TABS
2.0000 mg | ORAL_TABLET | Freq: Three times a day (TID) | ORAL | 0 refills | Status: DC | PRN
Start: 2021-10-19 — End: 2023-05-16

## 2021-10-19 MED ORDER — DOCUSATE SODIUM 100 MG PO CAPS: 100.0000 mg | ORAL_CAPSULE | Freq: Two times a day (BID) | ORAL | 0 refills | Status: DC

## 2021-10-19 MED ORDER — ONDANSETRON HCL 4 MG/2ML IJ SOLN
INTRAMUSCULAR | Status: AC
  Filled 2021-10-19: qty 2

## 2021-10-19 MED ORDER — OXYCODONE HCL 5 MG PO TABS: 5.0000 mg | ORAL_TABLET | Freq: Once | ORAL | Status: DC | PRN

## 2021-10-19 MED ORDER — TRANEXAMIC ACID-NACL 1000-0.7 MG/100ML-% IV SOLN
1000.0000 mg | INTRAVENOUS | Status: AC
  Administered 2021-10-19: 1000 mg via INTRAVENOUS
  Filled 2021-10-19: qty 100

## 2021-10-19 MED ORDER — LUBIPROSTONE 24 MCG PO CAPS
24.0000 ug | ORAL_CAPSULE | Freq: Two times a day (BID) | ORAL | Status: DC
  Administered 2021-10-20: 24 ug via ORAL
  Filled 2021-10-19 (×2): qty 1

## 2021-10-19 MED ORDER — BUPIVACAINE LIPOSOME 1.3 % IJ SUSP: 10.0000 mL | Freq: Once | INTRAMUSCULAR | Status: DC

## 2021-10-19 MED ORDER — HYDROMORPHONE HCL 1 MG/ML IJ SOLN
INTRAMUSCULAR | Status: AC
  Filled 2021-10-19: qty 1

## 2021-10-19 MED ORDER — ONDANSETRON HCL 4 MG/2ML IJ SOLN: 4.0000 mg | Freq: Once | INTRAMUSCULAR | Status: DC | PRN

## 2021-10-19 MED ORDER — BUPIVACAINE LIPOSOME 1.3 % IJ SUSP
INTRAMUSCULAR | Status: AC
Start: 2021-10-19 — End: ?
  Filled 2021-10-19: qty 20

## 2021-10-19 MED ORDER — LEVETIRACETAM ER 750 MG PO TB24
750.0000 mg | ORAL_TABLET | Freq: Every day | ORAL | Status: DC
Start: 2021-10-20 — End: 2021-10-20

## 2021-10-19 MED ORDER — MIDAZOLAM HCL 2 MG/2ML IJ SOLN
INTRAMUSCULAR | Status: AC
  Filled 2021-10-19: qty 2

## 2021-10-19 MED ORDER — BUPIVACAINE LIPOSOME 1.3 % IJ SUSP
INTRAMUSCULAR | Status: DC | PRN
  Administered 2021-10-19: 10 mL

## 2021-10-19 MED ORDER — LACTATED RINGERS IV BOLUS
500.0000 mL | Freq: Once | INTRAVENOUS | Status: AC
  Administered 2021-10-19: 500 mL via INTRAVENOUS

## 2021-10-19 MED ORDER — OXYCODONE HCL 5 MG/5ML PO SOLN: 5.0000 mg | Freq: Once | ORAL | Status: DC | PRN

## 2021-10-19 MED ORDER — PHENYLEPHRINE HCL-NACL 20-0.9 MG/250ML-% IV SOLN
INTRAVENOUS | Status: DC | PRN
  Administered 2021-10-19: 30 ug/min via INTRAVENOUS

## 2021-10-19 MED ORDER — OXYCODONE HCL 5 MG PO TABS
5.0000 mg | ORAL_TABLET | ORAL | Status: DC | PRN
  Administered 2021-10-19 (×2): 5 mg via ORAL
  Administered 2021-10-19: 10 mg via ORAL
  Administered 2021-10-20: 5 mg via ORAL
  Administered 2021-10-20: 10 mg via ORAL
  Administered 2021-10-20: 5 mg via ORAL
  Filled 2021-10-19: qty 2
  Filled 2021-10-19 (×2): qty 1
  Filled 2021-10-19: qty 2

## 2021-10-19 MED ORDER — LACTATED RINGERS IV SOLN: INTRAVENOUS | Status: DC

## 2021-10-19 MED ORDER — PROPOFOL 10 MG/ML IV BOLUS
INTRAVENOUS | Status: DC | PRN
  Administered 2021-10-19: 30 mg via INTRAVENOUS

## 2021-10-19 MED ORDER — SODIUM CHLORIDE (PF) 0.9 % IJ SOLN
INTRAMUSCULAR | Status: AC
  Filled 2021-10-19: qty 30

## 2021-10-19 MED ORDER — MIDAZOLAM HCL 5 MG/5ML IJ SOLN
INTRAMUSCULAR | Status: DC | PRN
  Administered 2021-10-19: 2 mg via INTRAVENOUS

## 2021-10-19 MED ORDER — AMISULPRIDE (ANTIEMETIC) 5 MG/2ML IV SOLN: 10.0000 mg | Freq: Once | INTRAVENOUS | Status: DC | PRN

## 2021-10-19 MED ORDER — INSULIN ASPART 100 UNIT/ML IJ SOLN: 0.0000 [IU] | Freq: Every day | INTRAMUSCULAR | Status: DC

## 2021-10-19 MED ORDER — LEVETIRACETAM ER 750 MG PO TB24
750.0000 mg | ORAL_TABLET | Freq: Every evening | ORAL | Status: DC
Start: 2021-10-19 — End: 2021-10-19

## 2021-10-19 MED ORDER — TAMSULOSIN HCL 0.4 MG PO CAPS
0.4000 mg | ORAL_CAPSULE | Freq: Every day | ORAL | Status: DC
Start: 2021-10-19 — End: 2021-10-19

## 2021-10-19 MED ORDER — FENTANYL CITRATE PF 50 MCG/ML IJ SOSY: 25.0000 ug | PREFILLED_SYRINGE | INTRAMUSCULAR | Status: DC | PRN

## 2021-10-19 MED ORDER — ASPIRIN 325 MG PO TBEC: 325.0000 mg | DELAYED_RELEASE_TABLET | Freq: Two times a day (BID) | ORAL | Status: DC

## 2021-10-19 MED ORDER — FESOTERODINE FUMARATE ER 4 MG PO TB24
4.0000 mg | ORAL_TABLET | Freq: Every day | ORAL | Status: DC
  Administered 2021-10-20: 4 mg via ORAL
  Filled 2021-10-19: qty 1

## 2021-10-19 MED ORDER — BUPIVACAINE LIPOSOME 1.3 % IJ SUSP
INTRAMUSCULAR | Status: AC
  Filled 2021-10-19: qty 10

## 2021-10-19 MED ORDER — ACETAMINOPHEN 500 MG PO TABS
1000.0000 mg | ORAL_TABLET | Freq: Once | ORAL | Status: AC
  Administered 2021-10-19: 1000 mg via ORAL
  Filled 2021-10-19: qty 2

## 2021-10-19 MED ORDER — METHOCARBAMOL 500 MG IVPB - SIMPLE MED: 500.0000 mg | Freq: Four times a day (QID) | INTRAVENOUS | Status: DC | PRN

## 2021-10-19 MED ORDER — BUPIVACAINE-EPINEPHRINE 0.25% -1:200000 IJ SOLN
INTRAMUSCULAR | Status: DC | PRN
  Administered 2021-10-19: 30 mL

## 2021-10-19 MED ORDER — TRANEXAMIC ACID-NACL 1000-0.7 MG/100ML-% IV SOLN
INTRAVENOUS | Status: AC
  Filled 2021-10-19: qty 100

## 2021-10-19 MED ORDER — PROPOFOL 10 MG/ML IV BOLUS
INTRAVENOUS | Status: AC
  Filled 2021-10-19: qty 20

## 2021-10-19 MED ORDER — WATER FOR IRRIGATION, STERILE IR SOLN
Status: DC | PRN
  Administered 2021-10-19: 2000 mL

## 2021-10-19 MED ORDER — SODIUM CHLORIDE 0.9 % IV SOLN: INTRAVENOUS | Status: DC

## 2021-10-19 MED ORDER — PANTOPRAZOLE SODIUM 40 MG PO TBEC
40.0000 mg | DELAYED_RELEASE_TABLET | Freq: Every day | ORAL | Status: DC
  Administered 2021-10-20: 40 mg via ORAL
  Filled 2021-10-19: qty 1

## 2021-10-19 MED ORDER — DEXAMETHASONE SODIUM PHOSPHATE 10 MG/ML IJ SOLN
INTRAMUSCULAR | Status: DC | PRN
  Administered 2021-10-19: 5 mg via INTRAVENOUS

## 2021-10-19 MED ORDER — PROPOFOL 1000 MG/100ML IV EMUL
INTRAVENOUS | Status: AC
  Filled 2021-10-19: qty 100

## 2021-10-19 MED ORDER — FENOFIBRATE 54 MG PO TABS
54.0000 mg | ORAL_TABLET | Freq: Every day | ORAL | Status: DC
  Administered 2021-10-19 – 2021-10-20 (×2): 54 mg via ORAL
  Filled 2021-10-19 (×2): qty 1

## 2021-10-19 MED ORDER — PROPOFOL 500 MG/50ML IV EMUL
INTRAVENOUS | Status: DC | PRN
  Administered 2021-10-19: 100 ug/kg/min via INTRAVENOUS

## 2021-10-19 MED ORDER — LEVETIRACETAM 500 MG PO TABS
750.0000 mg | ORAL_TABLET | ORAL | Status: AC
  Administered 2021-10-19: 750 mg via ORAL
  Filled 2021-10-19: qty 1

## 2021-10-19 MED ORDER — HYDROMORPHONE HCL 1 MG/ML IJ SOLN
0.5000 mg | INTRAMUSCULAR | Status: DC | PRN
  Administered 2021-10-19: 1 mg via INTRAVENOUS

## 2021-10-19 MED ORDER — ASPIRIN 325 MG PO TBEC
325.0000 mg | DELAYED_RELEASE_TABLET | Freq: Two times a day (BID) | ORAL | Status: DC
  Administered 2021-10-20: 325 mg via ORAL
  Filled 2021-10-19: qty 1

## 2021-10-19 MED ORDER — CEFAZOLIN SODIUM-DEXTROSE 2-4 GM/100ML-% IV SOLN
INTRAVENOUS | Status: AC
  Filled 2021-10-19: qty 100

## 2021-10-19 MED ORDER — LACTATED RINGERS IV BOLUS
250.0000 mL | Freq: Once | INTRAVENOUS | Status: AC
  Administered 2021-10-19: 250 mL via INTRAVENOUS

## 2021-10-19 MED ORDER — ONDANSETRON HCL 4 MG/2ML IJ SOLN: 4.0000 mg | Freq: Four times a day (QID) | INTRAMUSCULAR | Status: DC | PRN

## 2021-10-19 SURGICAL SUPPLY — 47 items
APL SKNCLS STERI-STRIP NONHPOA (GAUZE/BANDAGES/DRESSINGS)
BAG COUNTER SPONGE SURGICOUNT (BAG) ×1 IMPLANT
BAG SPEC THK2 15X12 ZIP CLS (MISCELLANEOUS) ×1
BAG SPNG CNTER NS LX DISP (BAG) ×1
BAG ZIPLOCK 12X15 (MISCELLANEOUS) ×1 IMPLANT
BENZOIN TINCTURE PRP APPL 2/3 (GAUZE/BANDAGES/DRESSINGS) IMPLANT
BLADE SAW SGTL 18X1.27X75 (BLADE) ×2 IMPLANT
BLADE SURG SZ10 CARB STEEL (BLADE) ×4 IMPLANT
CLSR STERI-STRIP ANTIMIC 1/2X4 (GAUZE/BANDAGES/DRESSINGS) ×1 IMPLANT
COVER PERINEAL POST (MISCELLANEOUS) ×2 IMPLANT
COVER SURGICAL LIGHT HANDLE (MISCELLANEOUS) ×2 IMPLANT
DRAPE FOOT SWITCH (DRAPES) ×2 IMPLANT
DRAPE STERI IOBAN 125X83 (DRAPES) ×2 IMPLANT
DRAPE U-SHAPE 47X51 STRL (DRAPES) ×4 IMPLANT
DRSG AQUACEL AG ADV 3.5X 6 (GAUZE/BANDAGES/DRESSINGS) ×2 IMPLANT
DRSG AQUACEL AG ADV 3.5X10 (GAUZE/BANDAGES/DRESSINGS) ×1 IMPLANT
DURAPREP 26ML APPLICATOR (WOUND CARE) ×2 IMPLANT
ELECT BLADE TIP CTD 4 INCH (ELECTRODE) ×2 IMPLANT
ELECT REM PT RETURN 15FT ADLT (MISCELLANEOUS) ×2 IMPLANT
ELIMINATOR HOLE APEX DEPUY (Hips) ×1 IMPLANT
GAUZE XEROFORM 1X8 LF (GAUZE/BANDAGES/DRESSINGS) IMPLANT
GLOVE BIOGEL PI IND STRL 8 (GLOVE) ×2 IMPLANT
GLOVE BIOGEL PI INDICATOR 8 (GLOVE) ×3
GLOVE ECLIPSE 7.5 STRL STRAW (GLOVE) ×5 IMPLANT
GOWN STRL REUS W/ TWL XL LVL3 (GOWN DISPOSABLE) ×2 IMPLANT
GOWN STRL REUS W/TWL XL LVL3 (GOWN DISPOSABLE) ×6
HEAD CERAMIC 36 PLUS 8.5 12 14 (Hips) ×1 IMPLANT
HOLDER FOLEY CATH W/STRAP (MISCELLANEOUS) ×1 IMPLANT
HOOD PEEL AWAY FLYTE STAYCOOL (MISCELLANEOUS) ×4 IMPLANT
KIT TURNOVER KIT A (KITS) ×1 IMPLANT
LINER PINN ALTRX ACTABR 36X60 (Liner) IMPLANT
LINER PINNACLE ALTRAX ACTABULR (Liner) ×2 IMPLANT
NEEDLE HYPO 22GX1.5 SAFETY (NEEDLE) ×2 IMPLANT
PACK ANTERIOR HIP CUSTOM (KITS) ×2 IMPLANT
PENCIL SMOKE EVACUATOR (MISCELLANEOUS) IMPLANT
PIN SECT CUP 60MM (Hips) ×1 IMPLANT
SPIKE FLUID TRANSFER (MISCELLANEOUS) ×2 IMPLANT
STAPLER VISISTAT 35W (STAPLE) IMPLANT
STEM FEMORAL SZ9 HIGH ACTIS (Stem) ×1 IMPLANT
STRIP CLOSURE SKIN 1/2X4 (GAUZE/BANDAGES/DRESSINGS) IMPLANT
SUT ETHIBOND NAB CT1 #1 30IN (SUTURE) ×5 IMPLANT
SUT MNCRL AB 3-0 PS2 18 (SUTURE) ×1 IMPLANT
SUT VIC AB 0 CT1 36 (SUTURE) ×2 IMPLANT
SUT VIC AB 1 CT1 36 (SUTURE) ×2 IMPLANT
SUT VIC AB 2-0 CT1 27 (SUTURE) ×2
SUT VIC AB 2-0 CT1 TAPERPNT 27 (SUTURE) ×1 IMPLANT
TRAY FOLEY MTR SLVR 16FR STAT (SET/KITS/TRAYS/PACK) ×1 IMPLANT

## 2021-10-19 NOTE — H&P (Signed)
TOTAL HIP ADMISSION H&P  Patient is admitted for right total hip arthroplasty.  Subjective:  Chief Complaint: right hip pain  HPI: [name], 71 y.o. male, has a history of pain and functional disability in the right hip(s) due to arthritis and patient has failed non-surgical conservative treatments for greater than 12 weeks to include NSAID's and/or analgesics, flexibility and strengthening excercises, supervised PT with diminished ADL's post treatment, use of assistive devices, weight reduction as appropriate, and activity modification.  Onset of symptoms was gradual starting 3 years ago with gradually worsening course since that time.The patient noted no past surgery on the right hip(s).  Patient currently rates pain in the right hip at 9 out of 10 with activity. Patient has night pain, worsening of pain with activity and weight bearing, trendelenberg gait, pain that interfers with activities of daily living, and pain with passive range of motion. Patient has evidence of subchondral sclerosis, periarticular osteophytes, and joint space narrowing by imaging studies. This condition presents safety issues increasing the risk of falls. This patient has had  failure of all reasonable conservative care .  There is no current active infection.  Patient Active Problem List   Diagnosis Date Noted   Primary osteoarthritis of right hip 10/19/2021   Urinary retention 01/17/2018   Genetic testing 12/02/2017   Family history of prostate cancer    Malignant neoplasm of prostate ([place]) 10/18/2017   Seizures ([employer]) 10/09/2012   Osteoarthritis of left shoulder 10/09/2012   Past Medical History:  Diagnosis Date   Arthritis    Chronic kidney disease    Diabetes mellitus without complication ([place])    Family history of prostate cancer    GERD (gastroesophageal reflux disease)    Hyperlipemia    Hypertriglyceridemia    Plantar fasciitis, bilateral    Prostate cancer ([place])    Seizures ([place])     started age 59-last one 55   Urinary retention    Wears glasses     Past Surgical History:  Procedure Laterality Date   BIOPSY PROSTATE  09/19/2017   BRONCHOSCOPY  11/2008   removal vocal cord polyp   CAPSULAR RELEASE Left 10/02/2012   Procedure: LEFT ARTHROSCOPY CAPSULAR RELEASE;  Surgeon: Nita Sells, MD;  Location: [facility];  Service: Orthopedics;  Laterality: Left;   COLONOSCOPY     CYSTOSCOPY N/A 11/17/2020   Procedure: CYSTOSCOPY, URETHRAL DILATATION,FOLEY CATH PLACEMENT,BALLOON DILATATION, URETEROSCOPY;  Surgeon: Irine Seal, MD;  Location: [facility];  Service: Urology;  Laterality: N/A;   KNEE ARTHROSCOPY  08/2011   lt   POLYPECTOMY  1988   vocal cords  x2 2010   TONSILLECTOMY     as a child   TRANSRECTAL ULTRASOUND  09/19/2017    Current Facility-Administered Medications  Medication Dose Route Frequency Provider Last Rate Last Admin   bupivacaine liposome (EXPAREL) 1.3 % injection 133 mg  10 mL Other Once Dorna Leitz, MD       ceFAZolin (ANCEF) IVPB 2g/100 mL premix  2 g Intravenous On Call to OR Dorna Leitz, MD       lactated ringers infusion   Intravenous Continuous Merlinda Frederick, MD 10 mL/hr at 10/19/21 0559 Restarted at 10/19/21 0650   phenylephrine (NEOSYNEPHRINE) 20-0.9 MG/250ML-% infusion            povidone-iodine 10 % swab 2 Application  2 Application Topical Once Dorna Leitz, MD       tranexamic acid (CYKLOKAPRON) IVPB 1,000 mg  1,000 mg Intravenous To  OR Dorna Leitz, MD       Allergies  Allergen Reactions   Prevacid [Lansoprazole]      Did not help    Social History   Tobacco Use   Smoking status: Never   Smokeless tobacco: Never   Tobacco comments:    pt denies ever smoking  Substance Use Topics   Alcohol use: No    Family History  Problem Relation Age of Onset   Arthritis Mother    Dementia Mother    Cancer - Prostate Father 82   Cancer - Prostate Brother 84   Alzheimer's disease Maternal Uncle    Autism  Daughter      Review of Systems ROS: I have reviewed the patient's review of systems thoroughly and there are no positive responses as relates to the HPI.  Objective:  Physical Exam  Vital signs in last 24 hours: Temp:  [97.4 F (36.3 C)] 97.4 F (36.3 C) (07/03 0536) Pulse Rate:  [65] 65 (07/03 0536) Resp:  [16] 16 (07/03 0536) BP: (121)/(79) 121/79 (07/03 0536) SpO2:  [100 %] 100 % (07/03 0536) Weight:  [90.3 kg] 90.3 kg (07/03 0536) Well-developed well-nourished patient in no acute distress. Alert and oriented x3 HEENT:within normal limits Cardiac: Regular rate and rhythm Pulmonary: Lungs clear to auscultation Abdomen: Soft and nontender.  Normal active bowel sounds  Musculoskeletal: (Right hip: Painful range of motion.  Limited range of motion.  No instability. Labs: Recent Results (from the past 2160 hour(s))  Glucose, capillary     Status: Abnormal   Collection Time: 10/12/21  9:50 AM  Result Value Ref Range   Glucose-Capillary 107 (H) 70 - 99 mg/dL    Comment: Glucose reference range applies only to samples taken after fasting for at least 8 hours.  Hemoglobin A1c per protocol     Status: Abnormal   Collection Time: 10/12/21 10:04 AM  Result Value Ref Range   Hgb A1c MFr Bld 6.1 (H) 4.8 - 5.6 %    Comment: (NOTE)         Prediabetes: 5.7 - 6.4         Diabetes: >6.4         Glycemic control for adults with diabetes: <7.0    Mean Plasma Glucose 128 mg/dL    Comment: (NOTE) Performed At: [facility] [place], [place] [id number] Rush Farmer MD OE:[id number]   Basic metabolic panel per protocol     Status: Abnormal   Collection Time: 10/12/21 10:04 AM  Result Value Ref Range   Sodium 137 135 - 145 mmol/L   Potassium 4.4 3.5 - 5.1 mmol/L   Chloride 105 98 - 111 mmol/L   CO2 24 22 - 32 mmol/L   Glucose, Bld 89 70 - 99 mg/dL    Comment: Glucose reference range applies only to samples taken after fasting for at least 8 hours.   BUN 32  (H) 8 - 23 mg/dL   Creatinine, Ser 1.78 (H) 0.61 - 1.24 mg/dL   Calcium 9.5 8.9 - 10.3 mg/dL   GFR, Estimated 41 (L) >60 mL/min    Comment: (NOTE) Calculated using the CKD-EPI Creatinine Equation (2021)    Anion gap 8 5 - 15    Comment: Performed at [facility], [place] [address], [place], [place] [address]  CBC per protocol     Status: None   Collection Time: 10/12/21 10:04 AM  Result Value Ref Range   WBC 7.2 4.0 - 10.5 K/uL  RBC 4.82 4.22 - 5.81 MIL/uL   Hemoglobin 16.1 13.0 - 17.0 g/dL   HCT 46.9 39.0 - 52.0 %   MCV 97.3 80.0 - 100.0 fL   MCH 33.4 26.0 - 34.0 pg   MCHC 34.3 30.0 - 36.0 g/dL   RDW 11.5 11.5 - 15.5 %   Platelets 236 150 - 400 K/uL   nRBC 0.0 0.0 - 0.2 %    Comment: Performed at [facility], [place] [address] [place], [place] [address]  Type and screen Order type and screen if day of surgery is less than 15 days from draw of preadmission visit or order morning of surgery if day of surgery is greater than 6 days from preadmission visit.     Status: None   Collection Time: 10/12/21 10:04 AM  Result Value Ref Range   ABO/RH(D) O NEG    Antibody Screen NEG    Sample Expiration 10/26/2021,2359    Extend sample reason      NO TRANSFUSIONS OR PREGNANCY IN THE PAST 3 MONTHS Performed at [facility], [place] [address], [place], [place] [address]   Surgical pcr screen     Status: None   Collection Time: 10/12/21 10:04 AM   Specimen: Nasal Mucosa; Nasal Swab  Result Value Ref Range   MRSA, PCR NEGATIVE NEGATIVE   Staphylococcus aureus NEGATIVE NEGATIVE    Comment: (NOTE) The Xpert SA Assay (FDA approved for NASAL specimens in patients 18 years of age and older), is one component of a comprehensive surveillance program. It is not intended to diagnose infection nor to guide or monitor treatment. Performed at [facility], [place] [address], [place], [place] [address]   ABO/Rh     Status: None    Collection Time: 10/19/21  5:00 AM  Result Value Ref Range   ABO/RH(D)      Jenetta Downer NEG Performed at [place] [address], [place], [place] [address]      Estimated body mass index is 26.25 kg/m as calculated from the following:   Height as of this encounter: '6\' 1"'$  (1.854 m).   Weight as of this encounter: 90.3 kg.   Imaging Review Plain radiographs demonstrate severe degenerative joint disease of the right hip(s). The bone quality appears to be fair for age and reported activity level.      Assessment/Plan:  End stage arthritis, right hip(s)  The patient history, physical examination, clinical judgement of the provider and imaging studies are consistent with end stage degenerative joint disease of the right hip(s) and total hip arthroplasty is deemed medically necessary. The treatment options including medical management, injection therapy, arthroscopy and arthroplasty were discussed at length. The risks and benefits of total hip arthroplasty were presented and reviewed. The risks due to aseptic loosening, infection, stiffness, dislocation/subluxation,  thromboembolic complications and other imponderables were discussed.  The patient acknowledged the explanation, agreed to proceed with the plan and consent was signed. Patient is being admitted for inpatient treatment for surgery, pain control, PT, OT, prophylactic antibiotics, VTE prophylaxis, progressive ambulation and ADL's and discharge planning.The patient is planning to be discharged home with home health services

## 2021-10-19 NOTE — Evaluation (Signed)
Physical Therapy Evaluation Patient Details Name: [name] MRN: [id number] DOB: [birth year] Today's Date: 10/19/2021  History of Present Illness  71 yo male s/p R DA THA. PMH: CKD, DM, Szs, urinary, prostate CA  Clinical Impression  Pt is s/p THA resulting in the deficits listed below (see PT Problem List).  Pt able to amb ~ 56' with RW and min assist however continues to have significant numbness over R > L glut, repeated incontinence episodes. Pt and wife did not feel safe attempting stairs, PT in agreement. Recommend pt stay overnight to address issues above for safe d/c home.   Pt will benefit from skilled PT to increase their independence and safety with mobility to allow discharge to the venue listed below.         Recommendations for follow up therapy are one component of a multi-disciplinary discharge planning process, led by the attending physician.  Recommendations may be updated based on patient status, additional functional criteria and insurance authorization.  Follow Up Recommendations Follow physician's recommendations for discharge plan and follow up therapies      Assistance Recommended at Discharge Intermittent Supervision/Assistance  Patient can return home with the following  A little help with walking and/or transfers;Help with stairs or ramp for entrance;Assist for transportation;Assistance with cooking/housework    Equipment Recommendations None recommended by PT  Recommendations for Other Services       Functional Status Assessment Patient has had a recent decline in their functional status and demonstrates the ability to make significant improvements in function in a reasonable and predictable amount of time.     Precautions / Restrictions Precautions Precautions: Fall Restrictions Weight Bearing Restrictions: No Other Position/Activity Restrictions: WBAT      Mobility  Bed Mobility Overal bed mobility: Needs Assistance Bed Mobility: Supine  to Sit     Supine to sit: Min assist     General bed mobility comments: assist to elevate trunk    Transfers Overall transfer level: Needs assistance Equipment used: Rolling walker (2 wheels) Transfers: Sit to/from Stand Sit to Stand: Min assist           General transfer comment: assist to rise and stabilize    Ambulation/Gait Ambulation/Gait assistance: Min assist Gait Distance (Feet): 80 Feet Assistive device: Rolling walker (2 wheels) Gait Pattern/deviations: Step-to pattern       General Gait Details: cues for sequence and use of UEs as needed  Stairs            Wheelchair Mobility    Modified Rankin (Stroke Patients Only)       Balance Overall balance assessment: Needs assistance Sitting-balance support: Feet supported, Single extremity supported Sitting balance-Leahy Scale: Fair     Standing balance support: Reliant on assistive device for balance Standing balance-Leahy Scale: Poor                               Pertinent Vitals/Pain Pain Assessment Pain Assessment: Faces Faces Pain Scale: Hurts a little bit Pain Location: right hip Pain Descriptors / Indicators: Grimacing, Sore Pain Intervention(s): Limited activity within patient's tolerance, Monitored during session, Premedicated before session, Repositioned    Home Living Family/patient expects to be discharged to:: Private residence Living Arrangements: Spouse/significant other;Children Available Help at Discharge: Family;Available 24 hours/day Type of Home: House Home Access: Stairs to enter Entrance Stairs-Rails: Right Entrance Stairs-Number of Steps: 3   Home Layout: Two level Home Equipment: Conservation officer, nature (2 wheels)  Prior Function Prior Level of Function : Independent/Modified Independent                     Hand Dominance        Extremity/Trunk Assessment   Upper Extremity Assessment Upper Extremity Assessment: Overall WFL for tasks  assessed    Lower Extremity Assessment Lower Extremity Assessment: RLE deficits/detail RLE Deficits / Details: ankle WFL; right knee and hip grossly 2+ to 3/5; AAROM WFL; R glut and anterior thigh absent to light touch RLE Sensation: decreased light touch       Communication      Cognition Arousal/Alertness: Awake/alert Behavior During Therapy: WFL for tasks assessed/performed Overall Cognitive Status: Within Functional Limits for tasks assessed                                          General Comments      Exercises Total Joint Exercises Ankle Circles/Pumps: AROM, Both, 10 reps Quad Sets: Right, AROM, 5 reps Heel Slides: AAROM, Right, 5 reps   Assessment/Plan    PT Assessment Patient needs continued PT services  PT Problem List Decreased strength;Decreased mobility;Decreased range of motion;Decreased activity tolerance;Decreased balance;Decreased knowledge of use of DME;Pain       PT Treatment Interventions DME instruction;Therapeutic exercise;Gait training;Functional mobility training;Therapeutic activities;Patient/family education    PT Goals (Current goals can be found in the Care Plan section)  Acute Rehab PT Goals Patient Stated Goal: be safe going home PT Goal Formulation: With patient/family Time For Goal Achievement: 10/26/21 Potential to Achieve Goals: Good    Frequency 7X/week     Co-evaluation               AM-PAC PT "6 Clicks" Mobility  Outcome Measure Help needed turning from your back to your side while in a flat bed without using bedrails?: A Little Help needed moving from lying on your back to sitting on the side of a flat bed without using bedrails?: A Little Help needed moving to and from a bed to a chair (including a wheelchair)?: A Little Help needed standing up from a chair using your arms (e.g., wheelchair or bedside chair)?: A Little Help needed to walk in hospital room?: A Little Help needed climbing 3-5 steps  with a railing? : A Lot 6 Click Score: 17    End of Session Equipment Utilized During Treatment: Gait belt Activity Tolerance: Patient tolerated treatment well Patient left: with call bell/phone within reach;in chair;with family/visitor present Nurse Communication: Mobility status PT Visit Diagnosis: Other abnormalities of gait and mobility (R26.89);Difficulty in walking, not elsewhere classified (R26.2)    Time: 3212-2482 PT Time Calculation (min) (ACUTE ONLY): 36 min   Charges:   PT Evaluation $PT Eval Low Complexity: 1 Low PT Treatments $Gait Training: 8-22 mins        Baxter Flattery, PT  Acute Rehab Dept [facility]) [phone]  [facility] Weekend Pager [facility] only)  [phone]  10/19/2021   [facility] 10/19/2021, 5:08 PM

## 2021-10-19 NOTE — Anesthesia Procedure Notes (Signed)
Spinal  Patient location during procedure: OR Start time: 10/19/2021 7:32 AM End time: 10/19/2021 7:35 AM Reason for block: surgical anesthesia Staffing Performed: resident/CRNA  Resident/CRNA: Claudia Desanctis, CRNA Performed by: Claudia Desanctis, CRNA Authorized by: Merlinda Frederick, MD   Preanesthetic Checklist Completed: patient identified, IV checked, site marked, risks and benefits discussed, surgical consent, monitors and equipment checked, pre-op evaluation and timeout performed Spinal Block Patient position: sitting Prep: DuraPrep Patient monitoring: heart rate, cardiac monitor, continuous pulse ox and blood pressure Approach: midline Location: L3-4 Injection technique: single-shot Needle Needle type: Pencan  Needle gauge: 24 G Needle length: 10 cm Needle insertion depth: 9 cm Assessment Sensory level: T4 Events: CSF return Additional Notes IV functioning, monitors applied to pt. Expiration date of kit checked and confirmed to be in date. Sterile prep and drape, hand hygiene and sterile gloved used. Pt was positioned and spine was prepped in sterile fashion. Skin was anesthetized with lidocaine. Free flow of clear CSF obtained prior to injecting local anesthetic into CSF x 1 attempt. Spinal needle aspirated freely following injection. Needle was carefully withdrawn, and pt tolerated procedure well. Loss of motor and sensory on exam post injection.

## 2021-10-19 NOTE — Discharge Instructions (Signed)

## 2021-10-19 NOTE — Transfer of Care (Signed)
Immediate Anesthesia Transfer of Care Note  Patient: [name]  Procedure(s) Performed: TOTAL HIP ARTHROPLASTY ANTERIOR APPROACH (Right: Hip)  Patient Location: PACU  Anesthesia Type:Spinal  Level of Consciousness: awake and patient cooperative  Airway & Oxygen Therapy: Patient Spontanous Breathing and Patient connected to face mask  Post-op Assessment: Report given to RN and Post -op Vital signs reviewed and stable  Post vital signs: stable  Last Vitals:  Vitals Value Taken Time  BP    Temp    Pulse    Resp    SpO2      Last Pain:  Vitals:   10/19/21 0536  TempSrc: Oral  PainSc: 4       Patients Stated Pain Goal: 2 (07/57/32 2567)  Complications: No notable events documented.

## 2021-10-19 NOTE — Anesthesia Postprocedure Evaluation (Signed)
Anesthesia Post Note  Patient: [name]  Procedure(s) Performed: TOTAL HIP ARTHROPLASTY ANTERIOR APPROACH (Right: Hip)     Patient location during evaluation: PACU Anesthesia Type: Spinal Level of consciousness: oriented and awake and alert Pain management: pain level controlled Vital Signs Assessment: post-procedure vital signs reviewed and stable Respiratory status: spontaneous breathing and respiratory function stable Cardiovascular status: blood pressure returned to baseline and stable Postop Assessment: no headache, no backache, no apparent nausea or vomiting and patient able to bend at knees Anesthetic complications: no   No notable events documented.  Last Vitals:  Vitals:   10/19/21 0915 10/19/21 0930  BP: 97/65 96/67  Pulse: (!) 58 (!) 52  Resp: 14 11  Temp: (!) 36.1 C   SpO2: 98% 97%    Last Pain:  Vitals:   10/19/21 0915  TempSrc:   PainSc: 0-No pain                 Merlinda Frederick

## 2021-10-19 NOTE — Anesthesia Procedure Notes (Signed)
Procedure Name: MAC Date/Time: 10/19/2021 7:26 AM  Performed by: Claudia Desanctis, CRNAPre-anesthesia Checklist: Patient identified, Emergency Drugs available, Suction available and Patient being monitored Patient Re-evaluated:Patient Re-evaluated prior to induction Oxygen Delivery Method: Simple face mask

## 2021-10-19 NOTE — Op Note (Addendum)
PATIENT ID:      [name]  MRN:     [id number] DOB/AGE:    [birth year] / 71 y.o.       OPERATIVE REPORT    DATE OF PROCEDURE:  10/19/2021       PREOPERATIVE DIAGNOSIS:  RIGHT HIP OSTEOARTHRITIS                                                       Estimated body mass index is 26.25 kg/m as calculated from the following:   Height as of this encounter: '6\' 1"'$  (1.854 m).   Weight as of this encounter: 90.3 kg.     POSTOPERATIVE DIAGNOSIS:  RIGHT HIP OSTEOARTHRITIS                                                           PROCEDURE:  1. right total hip arthroplasty using a 60 mm DePuy Pinnacle  Cup, [employer],  neutral liner, a +8.5 36 mm ceramic head,  and a #9 Actis high offset stem, 2.interpretation of multiple intraoperative fluoroscopic images   SURGEON: Alta Corning    ASSISTANT:   Gaspar Skeeters PA-C  (present throughout entire procedure and necessary for timely completion of the procedure)  ANESTHESIA: spinal  BLOOD LOSS: 300 Tranexamic Acid: 1 gram IV DRAINS: None COMPLICATIONS: None    NDICATIONS FOR PROCEDURE:Patient with end-stage arthritis of the right hip.  X-rays show bone-on-bone arthritic changes. Despite conservative measures with observation, anti-inflammatory medicine, narcotics, use of a cane, has severe unremitting pain and can ambulate only less than 1 block before resting.  Patient desires elective right total hip arthroplasty to decrease pain and increase function. The risks, benefits, and alternatives were discussed at length including but not limited to the risks of infection, bleeding, nerve injury, stiffness, blood clots, the need for revision surgery, cardiopulmonary complications, among others, and they were willing to proceed.Benefits have been discussed. Questions answered.     PROCEDURE IN DETAIL: The patient was identified by armband,  received preoperative IV antibiotics in the holding area at [facility], taken to the operating  room , appropriate anesthetic monitors  were attached and spinal anesthesia was induced.  The patient was placed onto the hot bed and all bony prominences were well-padded.The right hip was prepped and draped for an anterior approach to the hip.  An incision was made and the subcutaneous dissection was down to the level of the tensor fascia.  The fascia was opened and finger dissected.  The bleeders coming across the anterior portion of the hip were identified and cauterized. Retractors were put in place above and below the femoral neck.  The capsule was opened and tagged and a provisional neck cut was made.  The head was removed and sized on the back table.  The acetabulum was sequentially reamed to a level of 59 mm and a 560 mm porous-coated pinnacle cup was hammered into place with 45 of lateral opening and 30 of anteversion.fluoroscopy was used to ensure this position of the cup.  Attention was turned towards the femur where the leg was actually rotated, extended,  and adduction did.  The femur was sequentially broached until a size of 9 broach gave a perfect fit and fill.at this point a  8.5 mm delta ceramic hip ball was placed and the hip reduced.  Fluoroscopic images were taken to assess the leg length, fit and fill of the stem, and cup position.  We were happy with the construct at this point.  The 9 broach was removed and a final Actis stem with high offset  and a 8.5 mm ceramic hip ball was placed and reduced.  Final images were taken to make certain there were happy with the position at this point.   The capsule was closed with #1 Vicryl suture.  The tensor fascia was closed with 0 Vicryl suture.  The skin was then closed with combination of 0 and 2-0 Vicryl suture.  The top layer was with 3-0 Monocryl suture.  Benzoin and Steri-Strips were applied  and a sterile compressive dressing was applied and the patient taken to recovery room she noted be in satisfactory condition.  Past medical Motion for  the procedure was approximately 300 cc.  Of note Gaspar Skeeters was with the entire case and assisted by retraction of tissues, manipulation of the leg, and closing the minimize or time.    Alta Corning 10/12/2020, 6:15 PM  Alta Corning 10/19/2021, 8:54 AM

## 2021-10-20 DIAGNOSIS — E1122 Type 2 diabetes mellitus with diabetic chronic kidney disease: Secondary | ICD-10-CM | POA: Diagnosis not present

## 2021-10-20 DIAGNOSIS — Z8546 Personal history of malignant neoplasm of prostate: Secondary | ICD-10-CM | POA: Diagnosis not present

## 2021-10-20 DIAGNOSIS — M1611 Unilateral primary osteoarthritis, right hip: Secondary | ICD-10-CM | POA: Diagnosis not present

## 2021-10-20 DIAGNOSIS — N189 Chronic kidney disease, unspecified: Secondary | ICD-10-CM | POA: Diagnosis not present

## 2021-10-20 LAB — GLUCOSE, CAPILLARY: Glucose-Capillary: 195 mg/dL — ABNORMAL HIGH (ref 70–99)

## 2021-10-20 NOTE — TOC Transition Note (Signed)
Transition of Care (TOC) - CM/SW Discharge Note   Patient Details  Name: Alaa R Leveille MRN: [id number] Date of Birth: 04/03/1951  Transition of Care (TOC) CM/SW Contact:  HOYLE, LUCY, LCSW Phone Number: 10/20/2021, 11:24 AM   Clinical Narrative:    Met with pt and confirming he has all needed DME at home.  OPPT already set up at MD office.  No TOC needs.   Final next level of care: OP Rehab Barriers to Discharge: No Barriers Identified   Patient Goals and CMS Choice Patient states their goals for this hospitalization and ongoing recovery are:: return home      Discharge Placement                       Discharge Plan and Services                DME Arranged: N/A DME Agency: NA                  Social Determinants of Health (SDOH) Interventions     Readmission Risk Interventions     No data to display             

## 2021-10-20 NOTE — Progress Notes (Signed)
10/20/21 1400  PT Visit Information  Last PT Received On 10/20/21  Assistance Needed +1  Pt progressing well, meeting oals. Wife present for session. Pt ready to d/c with wife's assist as needed from PT standpoint.  History of Present Illness 71 yo male s/p R DA THA. PMH: CKD, DM, Szs, urinary, prostate CA  Subjective Data  Patient Stated Goal be safe going home  Precautions  Precautions Fall  Restrictions  RLE Weight Bearing WBAT  Pain Assessment  Pain Assessment 0-10  Pain Score 4  Pain Location right hip  Pain Descriptors / Indicators Grimacing;Sore  Pain Intervention(s) Limited activity within patient's tolerance;Monitored during session;Premedicated before session  Cognition  Arousal/Alertness Awake/alert  Behavior During Therapy WFL for tasks assessed/performed  Overall Cognitive Status Within Functional Limits for tasks assessed  Bed Mobility  General bed mobility comments in chair  Transfers  Overall transfer level Needs assistance  Equipment used Rolling walker (2 wheels)  Transfers Sit to/from Stand  Sit to Stand Min guard;Supervision  General transfer comment cues for hand placement  Ambulation/Gait  Ambulation/Gait assistance Min guard;Supervision  Gait Distance (Feet) 80 Feet  Assistive device Rolling walker (2 wheels)  Gait Pattern/deviations Step-to pattern;Antalgic  General Gait Details cues for sequence, posture, and RW position  Stairs Yes  Stairs assistance Min guard  Stair Management One rail Right;One rail Left;Sideways;Step to pattern  Number of Stairs 3 (x2)  General stair comments cues for technique and sequence, wife present. pt with good stability, no LOB  Balance  Sitting balance-Leahy Scale Fair  Standing balance support Reliant on assistive device for balance  Standing balance-Leahy Scale Poor  Total Joint Exercises  Heel Slides AAROM;Right;5 reps  PT - End of Session  Equipment Utilized During Treatment Gait belt  Activity  Tolerance Patient tolerated treatment well  Patient left in bed;with call bell/phone within reach;with bed alarm set;with family/visitor present  Nurse Communication Mobility status   PT - Assessment/Plan  PT Plan Current plan remains appropriate  PT Visit Diagnosis Other abnormalities of gait and mobility (R26.89);Difficulty in walking, not elsewhere classified (R26.2)  PT Frequency (ACUTE ONLY) 7X/week  Follow Up Recommendations Follow physician's recommendations for discharge plan and follow up therapies  Assistance recommended at discharge Intermittent Supervision/Assistance  Patient can return home with the following A little help with walking and/or transfers;Help with stairs or ramp for entrance;Assist for transportation;Assistance with cooking/housework  PT equipment None recommended by PT  AM-PAC PT "6 Clicks" Mobility Outcome Measure (Version 2)  Help needed turning from your back to your side while in a flat bed without using bedrails? 3  Help needed moving from lying on your back to sitting on the side of a flat bed without using bedrails? 3  Help needed moving to and from a bed to a chair (including a wheelchair)? 3  Help needed standing up from a chair using your arms (e.g., wheelchair or bedside chair)? 3  Help needed to walk in hospital room? 3  Help needed climbing 3-5 steps with a railing?  3  6 Click Score 18  Consider Recommendation of Discharge To: Home with [facility]  Progressive Mobility  What is the highest level of mobility based on the progressive mobility assessment? Level 5 (Walks with assist in room/hall) - Balance while stepping forward/back and can walk in room with assist - Complete  Activity Ambulated with assistance in hallway  PT Goal Progression  Progress towards PT goals Progressing toward goals  Acute Rehab PT Goals  PT  Goal Formulation With patient/family  Time For Goal Achievement 10/26/21  Potential to Achieve Goals Good  PT Time Calculation  PT Start  Time (ACUTE ONLY) 1402  PT Stop Time (ACUTE ONLY) 1434  PT Time Calculation (min) (ACUTE ONLY) 32 min  PT General Charges  $$ ACUTE PT VISIT 1 Visit  PT Treatments  $Gait Training 23-37 mins

## 2021-10-20 NOTE — Discharge Summary (Signed)
Patient ID: [name] MRN: [id number] DOB/AGE: 06-28-1950 71 y.o.  Admit date: 10/19/2021 Discharge date: 10/20/2021  Admission Diagnoses:  Principal Problem:   Primary osteoarthritis of right hip Active Problems:   S/P total right hip arthroplasty   Discharge Diagnoses:  Same  Past Medical History:  Diagnosis Date   Arthritis    Chronic kidney disease    Diabetes mellitus without complication (HCC)    Family history of prostate cancer    GERD (gastroesophageal reflux disease)    Hyperlipemia    Hypertriglyceridemia    Plantar fasciitis, bilateral    Prostate cancer ([place])    Seizures ([place])    started age 39-last one 47   Urinary retention    Wears glasses     Surgeries: Procedure(s): TOTAL HIP ARTHROPLASTY ANTERIOR APPROACH on 10/19/2021   Consultants:   Discharged Condition: Improved  Hospital Course: [name] is an 71 y.o. male who was admitted 10/19/2021 for operative treatment ofPrimary osteoarthritis of right hip. Patient has severe unremitting pain that affects sleep, daily activities, and work/hobbies. After pre-op clearance the patient was taken to the operating room on 10/19/2021 and underwent  Procedure(s): TOTAL HIP ARTHROPLASTY ANTERIOR APPROACH.    Patient was given perioperative antibiotics:  Anti-infectives (From admission, onward)    Start     Dose/Rate Route Frequency Ordered Stop   10/19/21 1523  ceFAZolin (ANCEF) 2-4 GM/100ML-% IVPB       Note to Pharmacy: Marylyn Ishihara: cabinet override      10/19/21 1523 10/20/21 0329   10/19/21 0915  ceFAZolin (ANCEF) IVPB 2g/100 mL premix        2 g 200 mL/hr over 30 Minutes Intravenous Every 6 hours 10/19/21 0913 10/19/21 2114   10/19/21 0600  ceFAZolin (ANCEF) IVPB 2g/100 mL premix        2 g 200 mL/hr over 30 Minutes Intravenous On call to O.R. 10/19/21 0505 10/19/21 0741        Patient was given sequential compression devices, early ambulation, and chemoprophylaxis to prevent  DVT.  Patient benefited maximally from hospital stay and there were no complications.    Recent vital signs: Patient Vitals for the past 24 hrs:  BP Temp Temp src Pulse Resp SpO2  10/20/21 0542 121/74 98.5 F (36.9 C) Oral 81 18 100 %  10/20/21 0222 121/64 98.9 F (37.2 C) Oral 76 18 98 %  10/19/21 2138 135/68 98.8 F (37.1 C) Oral 78 18 100 %  10/19/21 1801 94/72 97.9 F (36.6 C) -- 62 20 94 %  10/19/21 1654 123/81 -- -- 65 18 100 %  10/19/21 1600 128/79 -- -- (!) 59 18 100 %  10/19/21 1500 130/82 -- -- 64 16 100 %  10/19/21 1400 117/80 -- -- 66 16 99 %  10/19/21 1200 102/72 -- -- 60 14 98 %  10/19/21 1055 125/77 98 F (36.7 C) -- (!) 58 14 97 %  10/19/21 1045 (!) 117/92 -- -- (!) 55 14 97 %  10/19/21 1030 113/77 -- -- (!) 46 20 98 %  10/19/21 1015 112/74 -- -- (!) 54 17 97 %  10/19/21 1000 114/68 -- -- (!) 56 17 97 %  10/19/21 0945 103/76 -- -- (!) 55 15 95 %  10/19/21 0930 96/67 -- -- (!) 52 11 97 %     Recent laboratory studies: No results for input(s): "WBC", "HGB", "HCT", "PLT", "NA", "K", "CL", "CO2", "BUN", "CREATININE", "GLUCOSE", "INR", "CALCIUM" in the last 72 hours.  Invalid input(s): "  PT", "2"   Discharge Medications:   Allergies as of 10/20/2021       Reactions   Prevacid [lansoprazole]     Did not help        Medication List     TAKE these medications    aspirin EC 325 MG tablet Take 1 tablet (325 mg total) by mouth 2 (two) times daily after a meal. Take x 1 month post op to decrease risk of blood clots.   cholecalciferol 25 MCG (1000 UNIT) tablet Commonly known as: VITAMIN D3 Take 2,000-3,000 Units by mouth See admin instructions. 2000 units in the morning, 3000 units at bedtime   dexlansoprazole 60 MG capsule Commonly known as: DEXILANT Take 60 mg by mouth daily.   docusate sodium 100 MG capsule Commonly known as: Colace Take 1 capsule (100 mg total) by mouth 2 (two) times daily.   fenofibrate micronized 67 MG capsule Commonly known as:  LOFIBRA Take 67 mg by mouth daily before breakfast.   folic acid 673 MCG tablet Commonly known as: FOLVITE Take 800 mcg by mouth daily.   Ginger 500 MG Caps Take 500 mg by mouth every morning.   L-Arginine-500 500 MG Caps Generic drug: Arginine Take 500 mg by mouth daily.   Levetiracetam 750 MG Tb24 take 1 tablet by mouth every evening What changed: when to take this   lubiprostone 24 MCG capsule Commonly known as: AMITIZA Take 24 mcg by mouth 2 (two) times daily with a meal.   metFORMIN 500 MG tablet Commonly known as: GLUCOPHAGE Take 500 mg by mouth 2 (two) times daily with a meal.   Omega-3 1000 MG Caps Take 1,000 mg by mouth daily.   oxyCODONE-acetaminophen 5-325 MG tablet Commonly known as: PERCOCET/ROXICET Take 1-2 tablets by mouth every 6 (six) hours as needed for severe pain.   PRESERVISION AREDS PO Take 1 capsule by mouth 2 (two) times daily.   CENTRUM SILVER 50+MEN PO Take 1 tablet by mouth daily.   simvastatin 40 MG tablet Commonly known as: ZOCOR Take 40 mg by mouth at bedtime.   tamsulosin 0.4 MG Caps capsule Commonly known as: Flomax Take 1 capsule (0.4 mg total) by mouth daily after supper.   tiZANidine 2 MG tablet Commonly known as: ZANAFLEX Take 1 tablet (2 mg total) by mouth every 8 (eight) hours as needed for muscle spasms.   tolterodine 2 MG 24 hr capsule Commonly known as: DETROL LA Take 2 mg by mouth daily.               Durable Medical Equipment  (From admission, onward)           Start     Ordered   10/19/21 1806  DME Walker rolling  Once       Question:  Patient needs a walker to treat with the following condition  Answer:  Primary osteoarthritis of right hip   10/19/21 1805   10/19/21 1806  DME 3 n 1  Once        10/19/21 1805              Discharge Care Instructions  (From admission, onward)           Start     Ordered   10/19/21 0000  Weight bearing as tolerated       Question Answer Comment   Laterality left   Extremity Lower      10/19/21 0915  Diagnostic Studies: DG HIP UNILAT WITH PELVIS 1V RIGHT  Result Date: 10/19/2021 CLINICAL DATA:  Operative anterior right hip replacement EXAM: DG HIP (WITH OR WITHOUT PELVIS) 1V RIGHT COMPARISON:  February 21, 2014 FINDINGS: Total right hip replacement is identified without malalignment. IMPRESSION: Total right hip replacement without malalignment. Electronically Signed   By: Abelardo Diesel M.D.   On: 10/19/2021 09:09   DG C-Arm 1-60 Min-No Report  Result Date: 10/19/2021 Fluoroscopy was utilized by the requesting physician.  No radiographic interpretation.   DG C-Arm 1-60 Min-No Report  Result Date: 10/19/2021 Fluoroscopy was utilized by the requesting physician.  No radiographic interpretation.   DG Chest 2 View  Result Date: 10/12/2021 CLINICAL DATA:  Pre hip surgery evaluation. EXAM: CHEST - 2 VIEW COMPARISON:  11/18/2008 FINDINGS: Normal sized heart. Stable mild linear scarring at the right lung base. Otherwise, clear lungs. Lower thoracic spine degenerative changes. Minimal scoliosis. IMPRESSION: No active cardiopulmonary disease. Electronically Signed   By: Claudie Revering M.D.   On: 10/12/2021 13:49    Disposition: Discharge disposition: 01-Home or [place] as tolerated with walker -Keep dressing clean, dry, and intact. -Aspirin for DVT prophylaxis, oxycodone for pain control, tizanidine for spasms.   Follow-up with Dr. Berenice Primas in [place] [facility] [phone]) outpatient in 2 weeks  Discharge Instructions     Call MD / Call 911   Complete by: As directed    If you experience chest pain or shortness of breath, CALL 911 and be transported to the hospital emergency room.  If you develope a fever above 101 F, pus (white drainage) or increased drainage or redness at the wound, or calf pain, call your surgeon's office.   Call MD / Call 911   Complete by: As directed    If you experience  chest pain or shortness of breath, CALL 911 and be transported to the hospital emergency room.  If you develope a fever above 101 F, pus (white drainage) or increased drainage or redness at the wound, or calf pain, call your surgeon's office.   Constipation Prevention   Complete by: As directed    Drink plenty of fluids.  Prune juice may be helpful.  You may use a stool softener, such as Colace (over the counter) 100 mg twice a day.  Use MiraLax (over the counter) for constipation as needed.   Constipation Prevention   Complete by: As directed    Drink plenty of fluids.  Prune juice may be helpful.  You may use a stool softener, such as Colace (over the counter) 100 mg twice a day.  Use MiraLax (over the counter) for constipation as needed.   Diet - low sodium heart healthy   Complete by: As directed    Diet - low sodium heart healthy   Complete by: As directed    Follow the hip precautions as taught in Physical Therapy   Complete by: As directed    Increase activity slowly as tolerated   Complete by: As directed    Increase activity slowly as tolerated   Complete by: As directed    Post-operative opioid taper instructions:   Complete by: As directed    POST-OPERATIVE OPIOID TAPER INSTRUCTIONS: It is important to wean off of your opioid medication as soon as possible. If you do not need pain medication after your surgery it is ok to stop day one. Opioids include: Codeine, Hydrocodone(Norco, Vicodin), Oxycodone(Percocet, oxycontin) and hydromorphone amongst others.  Long term and even  short term use of opiods can cause: Increased pain response Dependence Constipation Depression Respiratory depression And more.  Withdrawal symptoms can include Flu like symptoms Nausea, vomiting And more Techniques to manage these symptoms Hydrate well Eat regular healthy meals Stay active Use relaxation techniques(deep breathing, meditating, yoga) Do Not substitute Alcohol to help with  tapering If you have been on opioids for less than two weeks and do not have pain than it is ok to stop all together.  Plan to wean off of opioids This plan should start within one week post op of your joint replacement. Maintain the same interval or time between taking each dose and first decrease the dose.  Cut the total daily intake of opioids by one tablet each day Next start to increase the time between doses. The last dose that should be eliminated is the evening dose.      Post-operative opioid taper instructions:   Complete by: As directed    POST-OPERATIVE OPIOID TAPER INSTRUCTIONS: It is important to wean off of your opioid medication as soon as possible. If you do not need pain medication after your surgery it is ok to stop day one. Opioids include: Codeine, Hydrocodone(Norco, Vicodin), Oxycodone(Percocet, oxycontin) and hydromorphone amongst others.  Long term and even short term use of opiods can cause: Increased pain response Dependence Constipation Depression Respiratory depression And more.  Withdrawal symptoms can include Flu like symptoms Nausea, vomiting And more Techniques to manage these symptoms Hydrate well Eat regular healthy meals Stay active Use relaxation techniques(deep breathing, meditating, yoga) Do Not substitute Alcohol to help with tapering If you have been on opioids for less than two weeks and do not have pain than it is ok to stop all together.  Plan to wean off of opioids This plan should start within one week post op of your joint replacement. Maintain the same interval or time between taking each dose and first decrease the dose.  Cut the total daily intake of opioids by one tablet each day Next start to increase the time between doses. The last dose that should be eliminated is the evening dose.      Weight bearing as tolerated   Complete by: As directed    Laterality: left   Extremity: Lower        Follow-up Information      Dorna Leitz, MD. Schedule an appointment as soon as possible for a visit in 2 week(s).   Specialty: Orthopedic Surgery Contact information: [place] [address] [phone]                  Signed: Moss Berry J Martin[place] 10/20/2021, 9:17 AM

## 2021-10-20 NOTE — Progress Notes (Signed)
     [name] is a 71 y.o. male   Orthopaedic diagnosis: Status post right total hip arthroplasty 10/19/21  Subjective: Patient appears comfortable in bed.  He reports his pain is well controlled.  Nerve block remains intact.  He has worked with physical therapy.  He has gotten up and walked several times.  He has no complaints of urinary or bowel issues.  He does appear to be a bit sedated which he contributes to postoperative pain medications.  He is looking forward to going home today.  He plans to stay with his wife at home.   Objectyive: Vitals:   10/20/21 0222 10/20/21 0542  BP: 121/64 121/74  Pulse: 76 81  Resp: 18 18  Temp: 98.9 F (37.2 C) 98.5 F (36.9 C)  SpO2: 98% 100%     Exam: Awake and alert Respirations even and unlabored No acute distress  Right hip shows clean, dry, intact dressing.  Minimal swelling.  No deformities.  Tolerates gentle active range of motion at the knee.  Has 5 out of 5 strength at the ankle.  He can wiggle her toes without difficulty.  Warm and well-perfused distally.  Assessment: Postop day 1 status post above, doing well and suitable for discharge home from an orthopedic standpoint.  Plan:  -Weightbearing as tolerated with walker -Keep dressing clean, dry, and intact. -Aspirin for DVT prophylaxis, oxycodone for pain control, tizanidine for spasms.  Follow-up with Dr. Berenice Primas in [place] [facility] [phone]) outpatient in 2 weeks.   Serai Tukes J. [place], PA-C

## 2021-10-20 NOTE — Progress Notes (Signed)
Physical Therapy Treatment Patient Details Name: [name] MRN: [id number] DOB: [birth year] Today's Date: 10/20/2021   History of Present Illness 71 yo male s/p R DA THA. PMH: CKD, DM, Szs, urinary, prostate CA    PT Comments    Pt is progressing toward goals. Will see again in pm for stair training. Will likely be ready for d./c later today if progress continues.  Recommendations for follow up therapy are one component of a multi-disciplinary discharge planning process, led by the attending physician.  Recommendations may be updated based on patient status, additional functional criteria and insurance authorization.  Follow Up Recommendations  Follow physician's recommendations for discharge plan and follow up therapies     Assistance Recommended at Discharge Intermittent Supervision/Assistance  Patient can return home with the following A little help with walking and/or transfers;Help with stairs or ramp for entrance;Assist for transportation;Assistance with cooking/housework   Equipment Recommendations  None recommended by PT    Recommendations for Other Services       Precautions / Restrictions Precautions Precautions: Fall Restrictions RLE Weight Bearing: Weight bearing as tolerated     Mobility  Bed Mobility               General bed mobility comments: in chair    Transfers Overall transfer level: Needs assistance Equipment used: Rolling walker (2 wheels) Transfers: Sit to/from Stand Sit to Stand: Min guard           General transfer comment: min/guard  to rise and stabilize, cues for hand placement    Ambulation/Gait Ambulation/Gait assistance: Min guard, Min assist Gait Distance (Feet): 70 Feet Assistive device: Rolling walker (2 wheels) Gait Pattern/deviations: Step-to pattern, Antalgic       General Gait Details: cues for sequence and RW position   Stairs             Wheelchair Mobility    Modified Rankin (Stroke  Patients Only)       Balance   Sitting-balance support: Feet supported, Single extremity supported Sitting balance-Leahy Scale: Fair     Standing balance support: Reliant on assistive device for balance Standing balance-Leahy Scale: Poor                              Cognition Arousal/Alertness: Awake/alert Behavior During Therapy: WFL for tasks assessed/performed Overall Cognitive Status: Within Functional Limits for tasks assessed                                          Exercises Total Joint Exercises Ankle Circles/Pumps: AROM, Both, 10 reps Quad Sets: AROM, Both, 10 reps    General Comments        Pertinent Vitals/Pain Pain Assessment Pain Assessment: 0-10 Pain Score: 5  Pain Location: right hip Pain Descriptors / Indicators: Grimacing, Sore Pain Intervention(s): Limited activity within patient's tolerance, Monitored during session, Premedicated before session, Repositioned, Ice applied    Home Living                          Prior Function            PT Goals (current goals can now be found in the care plan section) Acute Rehab PT Goals Patient Stated Goal: be safe going home PT Goal Formulation: With patient/family Time For Goal Achievement: 10/26/21 Potential  to Achieve Goals: Good Progress towards PT goals: Progressing toward goals    Frequency    7X/week      PT Plan Current plan remains appropriate    Co-evaluation              AM-PAC PT "6 Clicks" Mobility   Outcome Measure  Help needed turning from your back to your side while in a flat bed without using bedrails?: A Little Help needed moving from lying on your back to sitting on the side of a flat bed without using bedrails?: A Little Help needed moving to and from a bed to a chair (including a wheelchair)?: A Little Help needed standing up from a chair using your arms (e.g., wheelchair or bedside chair)?: A Little Help needed to walk in  hospital room?: A Little Help needed climbing 3-5 steps with a railing? : A Lot 6 Click Score: 17    End of Session Equipment Utilized During Treatment: Gait belt Activity Tolerance: Patient tolerated treatment well Patient left: in chair;with call bell/phone within reach;with chair alarm set Nurse Communication: Mobility status PT Visit Diagnosis: Other abnormalities of gait and mobility (R26.89);Difficulty in walking, not elsewhere classified (R26.2)     Time: 7737-3668 PT Time Calculation (min) (ACUTE ONLY): 21 min  Charges:  $Gait Training: 8-22 mins                     Baxter Flattery, PT  Acute Rehab Dept [facility]) [phone]  [facility] Weekend Pager [facility] only)  [phone]  10/20/2021    [facility] 10/20/2021, 12:54 PM

## 2021-10-21 DIAGNOSIS — Z96641 Presence of right artificial hip joint: Secondary | ICD-10-CM | POA: Diagnosis not present

## 2021-10-21 DIAGNOSIS — Z9889 Other specified postprocedural states: Secondary | ICD-10-CM | POA: Diagnosis not present

## 2021-10-21 DIAGNOSIS — R531 Weakness: Secondary | ICD-10-CM | POA: Diagnosis not present

## 2021-10-21 DIAGNOSIS — R2689 Other abnormalities of gait and mobility: Secondary | ICD-10-CM | POA: Diagnosis not present

## 2021-10-22 ENCOUNTER — Encounter (HOSPITAL_COMMUNITY): Payer: Self-pay | Admitting: Orthopedic Surgery

## 2021-10-23 DIAGNOSIS — R531 Weakness: Secondary | ICD-10-CM | POA: Diagnosis not present

## 2021-10-23 DIAGNOSIS — Z96641 Presence of right artificial hip joint: Secondary | ICD-10-CM | POA: Diagnosis not present

## 2021-10-23 DIAGNOSIS — Z9889 Other specified postprocedural states: Secondary | ICD-10-CM | POA: Diagnosis not present

## 2021-10-23 DIAGNOSIS — R2689 Other abnormalities of gait and mobility: Secondary | ICD-10-CM | POA: Diagnosis not present

## 2021-10-27 DIAGNOSIS — R531 Weakness: Secondary | ICD-10-CM | POA: Diagnosis not present

## 2021-10-27 DIAGNOSIS — Z9889 Other specified postprocedural states: Secondary | ICD-10-CM | POA: Diagnosis not present

## 2021-10-27 DIAGNOSIS — R2689 Other abnormalities of gait and mobility: Secondary | ICD-10-CM | POA: Diagnosis not present

## 2021-10-27 DIAGNOSIS — Z96641 Presence of right artificial hip joint: Secondary | ICD-10-CM | POA: Diagnosis not present

## 2021-10-29 DIAGNOSIS — R531 Weakness: Secondary | ICD-10-CM | POA: Diagnosis not present

## 2021-10-29 DIAGNOSIS — Z96641 Presence of right artificial hip joint: Secondary | ICD-10-CM | POA: Diagnosis not present

## 2021-10-29 DIAGNOSIS — R2689 Other abnormalities of gait and mobility: Secondary | ICD-10-CM | POA: Diagnosis not present

## 2021-10-29 DIAGNOSIS — Z9889 Other specified postprocedural states: Secondary | ICD-10-CM | POA: Diagnosis not present

## 2021-11-02 DIAGNOSIS — Z604 Social exclusion and rejection: Secondary | ICD-10-CM | POA: Diagnosis not present

## 2021-11-02 DIAGNOSIS — M1990 Unspecified osteoarthritis, unspecified site: Secondary | ICD-10-CM | POA: Diagnosis not present

## 2021-11-02 DIAGNOSIS — N3941 Urge incontinence: Secondary | ICD-10-CM | POA: Diagnosis not present

## 2021-11-02 DIAGNOSIS — E785 Hyperlipidemia, unspecified: Secondary | ICD-10-CM | POA: Diagnosis not present

## 2021-11-02 DIAGNOSIS — G40909 Epilepsy, unspecified, not intractable, without status epilepticus: Secondary | ICD-10-CM | POA: Diagnosis not present

## 2021-11-02 DIAGNOSIS — K5900 Constipation, unspecified: Secondary | ICD-10-CM | POA: Diagnosis not present

## 2021-11-02 DIAGNOSIS — R3915 Urgency of urination: Secondary | ICD-10-CM | POA: Diagnosis not present

## 2021-11-02 DIAGNOSIS — K219 Gastro-esophageal reflux disease without esophagitis: Secondary | ICD-10-CM | POA: Diagnosis not present

## 2021-11-02 DIAGNOSIS — E119 Type 2 diabetes mellitus without complications: Secondary | ICD-10-CM | POA: Diagnosis not present

## 2021-11-03 DIAGNOSIS — R531 Weakness: Secondary | ICD-10-CM | POA: Diagnosis not present

## 2021-11-03 DIAGNOSIS — R2689 Other abnormalities of gait and mobility: Secondary | ICD-10-CM | POA: Diagnosis not present

## 2021-11-03 DIAGNOSIS — Z471 Aftercare following joint replacement surgery: Secondary | ICD-10-CM | POA: Diagnosis not present

## 2021-11-03 DIAGNOSIS — Z9889 Other specified postprocedural states: Secondary | ICD-10-CM | POA: Diagnosis not present

## 2021-11-03 DIAGNOSIS — Z96641 Presence of right artificial hip joint: Secondary | ICD-10-CM | POA: Diagnosis not present

## 2021-11-04 DIAGNOSIS — R2689 Other abnormalities of gait and mobility: Secondary | ICD-10-CM | POA: Diagnosis not present

## 2021-11-04 DIAGNOSIS — R531 Weakness: Secondary | ICD-10-CM | POA: Diagnosis not present

## 2021-11-04 DIAGNOSIS — Z96641 Presence of right artificial hip joint: Secondary | ICD-10-CM | POA: Diagnosis not present

## 2021-11-04 DIAGNOSIS — Z9889 Other specified postprocedural states: Secondary | ICD-10-CM | POA: Diagnosis not present

## 2021-11-10 DIAGNOSIS — Z9889 Other specified postprocedural states: Secondary | ICD-10-CM | POA: Diagnosis not present

## 2021-11-10 DIAGNOSIS — Z96641 Presence of right artificial hip joint: Secondary | ICD-10-CM | POA: Diagnosis not present

## 2021-11-10 DIAGNOSIS — R531 Weakness: Secondary | ICD-10-CM | POA: Diagnosis not present

## 2021-11-10 DIAGNOSIS — R2689 Other abnormalities of gait and mobility: Secondary | ICD-10-CM | POA: Diagnosis not present

## 2021-11-15 DIAGNOSIS — E119 Type 2 diabetes mellitus without complications: Secondary | ICD-10-CM | POA: Diagnosis not present

## 2021-11-15 DIAGNOSIS — E785 Hyperlipidemia, unspecified: Secondary | ICD-10-CM | POA: Diagnosis not present

## 2021-11-15 DIAGNOSIS — N183 Chronic kidney disease, stage 3 unspecified: Secondary | ICD-10-CM | POA: Diagnosis not present

## 2021-11-16 DIAGNOSIS — N183 Chronic kidney disease, stage 3 unspecified: Secondary | ICD-10-CM | POA: Diagnosis not present

## 2021-11-16 DIAGNOSIS — E785 Hyperlipidemia, unspecified: Secondary | ICD-10-CM | POA: Diagnosis not present

## 2021-11-16 DIAGNOSIS — E119 Type 2 diabetes mellitus without complications: Secondary | ICD-10-CM | POA: Diagnosis not present

## 2021-11-17 DIAGNOSIS — Z9889 Other specified postprocedural states: Secondary | ICD-10-CM | POA: Diagnosis not present

## 2021-11-17 DIAGNOSIS — R2689 Other abnormalities of gait and mobility: Secondary | ICD-10-CM | POA: Diagnosis not present

## 2021-11-17 DIAGNOSIS — Z96641 Presence of right artificial hip joint: Secondary | ICD-10-CM | POA: Diagnosis not present

## 2021-11-17 DIAGNOSIS — R531 Weakness: Secondary | ICD-10-CM | POA: Diagnosis not present

## 2021-11-19 DIAGNOSIS — B351 Tinea unguium: Secondary | ICD-10-CM | POA: Diagnosis not present

## 2021-11-19 DIAGNOSIS — L570 Actinic keratosis: Secondary | ICD-10-CM | POA: Diagnosis not present

## 2021-11-19 DIAGNOSIS — R2689 Other abnormalities of gait and mobility: Secondary | ICD-10-CM | POA: Diagnosis not present

## 2021-11-19 DIAGNOSIS — Z9889 Other specified postprocedural states: Secondary | ICD-10-CM | POA: Diagnosis not present

## 2021-11-19 DIAGNOSIS — R531 Weakness: Secondary | ICD-10-CM | POA: Diagnosis not present

## 2021-11-19 DIAGNOSIS — X32XXXD Exposure to sunlight, subsequent encounter: Secondary | ICD-10-CM | POA: Diagnosis not present

## 2021-11-19 DIAGNOSIS — Z96641 Presence of right artificial hip joint: Secondary | ICD-10-CM | POA: Diagnosis not present

## 2021-11-23 DIAGNOSIS — R531 Weakness: Secondary | ICD-10-CM | POA: Diagnosis not present

## 2021-11-23 DIAGNOSIS — Z9889 Other specified postprocedural states: Secondary | ICD-10-CM | POA: Diagnosis not present

## 2021-11-23 DIAGNOSIS — R2689 Other abnormalities of gait and mobility: Secondary | ICD-10-CM | POA: Diagnosis not present

## 2021-11-23 DIAGNOSIS — Z96641 Presence of right artificial hip joint: Secondary | ICD-10-CM | POA: Diagnosis not present

## 2021-11-24 DIAGNOSIS — E119 Type 2 diabetes mellitus without complications: Secondary | ICD-10-CM | POA: Diagnosis not present

## 2021-11-24 DIAGNOSIS — H25813 Combined forms of age-related cataract, bilateral: Secondary | ICD-10-CM | POA: Diagnosis not present

## 2021-11-24 DIAGNOSIS — H43821 Vitreomacular adhesion, right eye: Secondary | ICD-10-CM | POA: Diagnosis not present

## 2021-11-24 DIAGNOSIS — H52223 Regular astigmatism, bilateral: Secondary | ICD-10-CM | POA: Diagnosis not present

## 2021-11-24 DIAGNOSIS — H35361 Drusen (degenerative) of macula, right eye: Secondary | ICD-10-CM | POA: Diagnosis not present

## 2021-11-26 DIAGNOSIS — Z9889 Other specified postprocedural states: Secondary | ICD-10-CM | POA: Diagnosis not present

## 2021-11-26 DIAGNOSIS — R2689 Other abnormalities of gait and mobility: Secondary | ICD-10-CM | POA: Diagnosis not present

## 2021-11-26 DIAGNOSIS — Z96641 Presence of right artificial hip joint: Secondary | ICD-10-CM | POA: Diagnosis not present

## 2021-11-26 DIAGNOSIS — R531 Weakness: Secondary | ICD-10-CM | POA: Diagnosis not present

## 2021-12-03 DIAGNOSIS — Z9889 Other specified postprocedural states: Secondary | ICD-10-CM | POA: Diagnosis not present

## 2021-12-03 DIAGNOSIS — R2689 Other abnormalities of gait and mobility: Secondary | ICD-10-CM | POA: Diagnosis not present

## 2021-12-03 DIAGNOSIS — Z96641 Presence of right artificial hip joint: Secondary | ICD-10-CM | POA: Diagnosis not present

## 2021-12-03 DIAGNOSIS — R531 Weakness: Secondary | ICD-10-CM | POA: Diagnosis not present

## 2021-12-06 DIAGNOSIS — K5900 Constipation, unspecified: Secondary | ICD-10-CM | POA: Diagnosis not present

## 2021-12-15 DIAGNOSIS — E119 Type 2 diabetes mellitus without complications: Secondary | ICD-10-CM | POA: Diagnosis not present

## 2021-12-15 DIAGNOSIS — N183 Chronic kidney disease, stage 3 unspecified: Secondary | ICD-10-CM | POA: Diagnosis not present

## 2021-12-15 DIAGNOSIS — E785 Hyperlipidemia, unspecified: Secondary | ICD-10-CM | POA: Diagnosis not present

## 2021-12-17 DIAGNOSIS — E119 Type 2 diabetes mellitus without complications: Secondary | ICD-10-CM | POA: Diagnosis not present

## 2021-12-17 DIAGNOSIS — E785 Hyperlipidemia, unspecified: Secondary | ICD-10-CM | POA: Diagnosis not present

## 2022-01-12 DIAGNOSIS — K5900 Constipation, unspecified: Secondary | ICD-10-CM | POA: Diagnosis not present

## 2022-01-12 DIAGNOSIS — D126 Benign neoplasm of colon, unspecified: Secondary | ICD-10-CM | POA: Diagnosis not present

## 2022-01-14 DIAGNOSIS — E119 Type 2 diabetes mellitus without complications: Secondary | ICD-10-CM | POA: Diagnosis not present

## 2022-01-14 DIAGNOSIS — E785 Hyperlipidemia, unspecified: Secondary | ICD-10-CM | POA: Diagnosis not present

## 2022-01-16 DIAGNOSIS — E119 Type 2 diabetes mellitus without complications: Secondary | ICD-10-CM | POA: Diagnosis not present

## 2022-01-16 DIAGNOSIS — E785 Hyperlipidemia, unspecified: Secondary | ICD-10-CM | POA: Diagnosis not present

## 2022-01-25 DIAGNOSIS — N1831 Chronic kidney disease, stage 3a: Secondary | ICD-10-CM | POA: Diagnosis not present

## 2022-01-25 DIAGNOSIS — E1129 Type 2 diabetes mellitus with other diabetic kidney complication: Secondary | ICD-10-CM | POA: Diagnosis not present

## 2022-01-25 DIAGNOSIS — E1122 Type 2 diabetes mellitus with diabetic chronic kidney disease: Secondary | ICD-10-CM | POA: Diagnosis not present

## 2022-01-25 DIAGNOSIS — R350 Frequency of micturition: Secondary | ICD-10-CM | POA: Diagnosis not present

## 2022-01-25 DIAGNOSIS — D631 Anemia in chronic kidney disease: Secondary | ICD-10-CM | POA: Diagnosis not present

## 2022-01-25 DIAGNOSIS — N2581 Secondary hyperparathyroidism of renal origin: Secondary | ICD-10-CM | POA: Diagnosis not present

## 2022-02-02 DIAGNOSIS — Z96641 Presence of right artificial hip joint: Secondary | ICD-10-CM | POA: Diagnosis not present

## 2022-02-02 DIAGNOSIS — M25551 Pain in right hip: Secondary | ICD-10-CM | POA: Diagnosis not present

## 2022-02-03 DIAGNOSIS — M79661 Pain in right lower leg: Secondary | ICD-10-CM | POA: Diagnosis not present

## 2022-02-13 DIAGNOSIS — E119 Type 2 diabetes mellitus without complications: Secondary | ICD-10-CM | POA: Diagnosis not present

## 2022-02-13 DIAGNOSIS — E785 Hyperlipidemia, unspecified: Secondary | ICD-10-CM | POA: Diagnosis not present

## 2022-02-16 DIAGNOSIS — E785 Hyperlipidemia, unspecified: Secondary | ICD-10-CM | POA: Diagnosis not present

## 2022-02-16 DIAGNOSIS — E119 Type 2 diabetes mellitus without complications: Secondary | ICD-10-CM | POA: Diagnosis not present

## 2022-03-15 DIAGNOSIS — E119 Type 2 diabetes mellitus without complications: Secondary | ICD-10-CM | POA: Diagnosis not present

## 2022-03-15 DIAGNOSIS — E785 Hyperlipidemia, unspecified: Secondary | ICD-10-CM | POA: Diagnosis not present

## 2022-03-18 DIAGNOSIS — E785 Hyperlipidemia, unspecified: Secondary | ICD-10-CM | POA: Diagnosis not present

## 2022-03-18 DIAGNOSIS — E119 Type 2 diabetes mellitus without complications: Secondary | ICD-10-CM | POA: Diagnosis not present

## 2022-03-19 DIAGNOSIS — G629 Polyneuropathy, unspecified: Secondary | ICD-10-CM | POA: Diagnosis not present

## 2022-03-19 DIAGNOSIS — E1121 Type 2 diabetes mellitus with diabetic nephropathy: Secondary | ICD-10-CM | POA: Diagnosis not present

## 2022-03-19 DIAGNOSIS — E782 Mixed hyperlipidemia: Secondary | ICD-10-CM | POA: Diagnosis not present

## 2022-03-19 DIAGNOSIS — N1831 Chronic kidney disease, stage 3a: Secondary | ICD-10-CM | POA: Diagnosis not present

## 2022-03-19 DIAGNOSIS — Z08 Encounter for follow-up examination after completed treatment for malignant neoplasm: Secondary | ICD-10-CM | POA: Diagnosis not present

## 2022-03-19 DIAGNOSIS — Z8546 Personal history of malignant neoplasm of prostate: Secondary | ICD-10-CM | POA: Diagnosis not present

## 2022-03-19 DIAGNOSIS — K219 Gastro-esophageal reflux disease without esophagitis: Secondary | ICD-10-CM | POA: Diagnosis not present

## 2022-03-19 DIAGNOSIS — Z0000 Encounter for general adult medical examination without abnormal findings: Secondary | ICD-10-CM | POA: Diagnosis not present

## 2022-03-19 DIAGNOSIS — Z1331 Encounter for screening for depression: Secondary | ICD-10-CM | POA: Diagnosis not present

## 2022-03-19 DIAGNOSIS — E559 Vitamin D deficiency, unspecified: Secondary | ICD-10-CM | POA: Diagnosis not present

## 2022-04-14 DIAGNOSIS — E785 Hyperlipidemia, unspecified: Secondary | ICD-10-CM | POA: Diagnosis not present

## 2022-04-14 DIAGNOSIS — E119 Type 2 diabetes mellitus without complications: Secondary | ICD-10-CM | POA: Diagnosis not present

## 2022-04-18 DIAGNOSIS — E785 Hyperlipidemia, unspecified: Secondary | ICD-10-CM | POA: Diagnosis not present

## 2022-04-18 DIAGNOSIS — E119 Type 2 diabetes mellitus without complications: Secondary | ICD-10-CM | POA: Diagnosis not present

## 2022-04-22 DIAGNOSIS — X32XXXD Exposure to sunlight, subsequent encounter: Secondary | ICD-10-CM | POA: Diagnosis not present

## 2022-04-22 DIAGNOSIS — L570 Actinic keratosis: Secondary | ICD-10-CM | POA: Diagnosis not present

## 2022-04-22 DIAGNOSIS — D225 Melanocytic nevi of trunk: Secondary | ICD-10-CM | POA: Diagnosis not present

## 2022-04-22 DIAGNOSIS — Z1283 Encounter for screening for malignant neoplasm of skin: Secondary | ICD-10-CM | POA: Diagnosis not present

## 2022-05-14 DIAGNOSIS — E785 Hyperlipidemia, unspecified: Secondary | ICD-10-CM | POA: Diagnosis not present

## 2022-05-14 DIAGNOSIS — M79671 Pain in right foot: Secondary | ICD-10-CM | POA: Diagnosis not present

## 2022-05-14 DIAGNOSIS — M79672 Pain in left foot: Secondary | ICD-10-CM | POA: Diagnosis not present

## 2022-05-14 DIAGNOSIS — E119 Type 2 diabetes mellitus without complications: Secondary | ICD-10-CM | POA: Diagnosis not present

## 2022-05-19 DIAGNOSIS — E785 Hyperlipidemia, unspecified: Secondary | ICD-10-CM | POA: Diagnosis not present

## 2022-05-19 DIAGNOSIS — E119 Type 2 diabetes mellitus without complications: Secondary | ICD-10-CM | POA: Diagnosis not present

## 2022-06-01 DIAGNOSIS — H5310 Unspecified subjective visual disturbances: Secondary | ICD-10-CM | POA: Diagnosis not present

## 2022-06-01 DIAGNOSIS — H43821 Vitreomacular adhesion, right eye: Secondary | ICD-10-CM | POA: Diagnosis not present

## 2022-06-01 DIAGNOSIS — H25813 Combined forms of age-related cataract, bilateral: Secondary | ICD-10-CM | POA: Diagnosis not present

## 2022-06-01 DIAGNOSIS — H35361 Drusen (degenerative) of macula, right eye: Secondary | ICD-10-CM | POA: Diagnosis not present

## 2022-06-01 DIAGNOSIS — H52223 Regular astigmatism, bilateral: Secondary | ICD-10-CM | POA: Diagnosis not present

## 2022-06-01 DIAGNOSIS — E119 Type 2 diabetes mellitus without complications: Secondary | ICD-10-CM | POA: Diagnosis not present

## 2022-06-02 DIAGNOSIS — M6281 Muscle weakness (generalized): Secondary | ICD-10-CM | POA: Diagnosis not present

## 2022-06-02 DIAGNOSIS — M722 Plantar fascial fibromatosis: Secondary | ICD-10-CM | POA: Diagnosis not present

## 2022-06-02 DIAGNOSIS — M2140 Flat foot [pes planus] (acquired), unspecified foot: Secondary | ICD-10-CM | POA: Diagnosis not present

## 2022-06-10 DIAGNOSIS — H353121 Nonexudative age-related macular degeneration, left eye, early dry stage: Secondary | ICD-10-CM | POA: Diagnosis not present

## 2022-06-10 DIAGNOSIS — H353112 Nonexudative age-related macular degeneration, right eye, intermediate dry stage: Secondary | ICD-10-CM | POA: Diagnosis not present

## 2022-06-17 DIAGNOSIS — E785 Hyperlipidemia, unspecified: Secondary | ICD-10-CM | POA: Diagnosis not present

## 2022-06-17 DIAGNOSIS — E119 Type 2 diabetes mellitus without complications: Secondary | ICD-10-CM | POA: Diagnosis not present

## 2022-06-21 DIAGNOSIS — L570 Actinic keratosis: Secondary | ICD-10-CM | POA: Diagnosis not present

## 2022-06-21 DIAGNOSIS — X32XXXD Exposure to sunlight, subsequent encounter: Secondary | ICD-10-CM | POA: Diagnosis not present

## 2022-06-21 DIAGNOSIS — C4441 Basal cell carcinoma of skin of scalp and neck: Secondary | ICD-10-CM | POA: Diagnosis not present

## 2022-07-07 DIAGNOSIS — E119 Type 2 diabetes mellitus without complications: Secondary | ICD-10-CM | POA: Diagnosis not present

## 2022-07-07 DIAGNOSIS — H2513 Age-related nuclear cataract, bilateral: Secondary | ICD-10-CM | POA: Diagnosis not present

## 2022-07-07 DIAGNOSIS — H353131 Nonexudative age-related macular degeneration, bilateral, early dry stage: Secondary | ICD-10-CM | POA: Diagnosis not present

## 2022-07-21 DIAGNOSIS — M25572 Pain in left ankle and joints of left foot: Secondary | ICD-10-CM | POA: Diagnosis not present

## 2022-07-21 DIAGNOSIS — M6281 Muscle weakness (generalized): Secondary | ICD-10-CM | POA: Diagnosis not present

## 2022-07-21 DIAGNOSIS — M79671 Pain in right foot: Secondary | ICD-10-CM | POA: Diagnosis not present

## 2022-07-23 DIAGNOSIS — M6281 Muscle weakness (generalized): Secondary | ICD-10-CM | POA: Diagnosis not present

## 2022-07-23 DIAGNOSIS — M25572 Pain in left ankle and joints of left foot: Secondary | ICD-10-CM | POA: Diagnosis not present

## 2022-07-23 DIAGNOSIS — M79671 Pain in right foot: Secondary | ICD-10-CM | POA: Diagnosis not present

## 2022-07-29 DIAGNOSIS — M79671 Pain in right foot: Secondary | ICD-10-CM | POA: Diagnosis not present

## 2022-07-29 DIAGNOSIS — M25572 Pain in left ankle and joints of left foot: Secondary | ICD-10-CM | POA: Diagnosis not present

## 2022-07-29 DIAGNOSIS — M6281 Muscle weakness (generalized): Secondary | ICD-10-CM | POA: Diagnosis not present

## 2022-08-02 DIAGNOSIS — M6281 Muscle weakness (generalized): Secondary | ICD-10-CM | POA: Diagnosis not present

## 2022-08-02 DIAGNOSIS — M79671 Pain in right foot: Secondary | ICD-10-CM | POA: Diagnosis not present

## 2022-08-02 DIAGNOSIS — M25572 Pain in left ankle and joints of left foot: Secondary | ICD-10-CM | POA: Diagnosis not present

## 2022-08-05 DIAGNOSIS — N1831 Chronic kidney disease, stage 3a: Secondary | ICD-10-CM | POA: Diagnosis not present

## 2022-08-09 DIAGNOSIS — M79671 Pain in right foot: Secondary | ICD-10-CM | POA: Diagnosis not present

## 2022-08-09 DIAGNOSIS — M6281 Muscle weakness (generalized): Secondary | ICD-10-CM | POA: Diagnosis not present

## 2022-08-09 DIAGNOSIS — M25572 Pain in left ankle and joints of left foot: Secondary | ICD-10-CM | POA: Diagnosis not present

## 2022-08-10 DIAGNOSIS — N1831 Chronic kidney disease, stage 3a: Secondary | ICD-10-CM | POA: Diagnosis not present

## 2022-08-10 DIAGNOSIS — E1122 Type 2 diabetes mellitus with diabetic chronic kidney disease: Secondary | ICD-10-CM | POA: Diagnosis not present

## 2022-08-10 DIAGNOSIS — N2581 Secondary hyperparathyroidism of renal origin: Secondary | ICD-10-CM | POA: Diagnosis not present

## 2022-08-10 DIAGNOSIS — D631 Anemia in chronic kidney disease: Secondary | ICD-10-CM | POA: Diagnosis not present

## 2022-08-11 DIAGNOSIS — M19072 Primary osteoarthritis, left ankle and foot: Secondary | ICD-10-CM | POA: Diagnosis not present

## 2022-08-16 DIAGNOSIS — M25572 Pain in left ankle and joints of left foot: Secondary | ICD-10-CM | POA: Diagnosis not present

## 2022-08-16 DIAGNOSIS — M79671 Pain in right foot: Secondary | ICD-10-CM | POA: Diagnosis not present

## 2022-08-16 DIAGNOSIS — M6281 Muscle weakness (generalized): Secondary | ICD-10-CM | POA: Diagnosis not present

## 2022-08-26 DIAGNOSIS — M25572 Pain in left ankle and joints of left foot: Secondary | ICD-10-CM | POA: Diagnosis not present

## 2022-08-26 DIAGNOSIS — M79671 Pain in right foot: Secondary | ICD-10-CM | POA: Diagnosis not present

## 2022-08-26 DIAGNOSIS — M6281 Muscle weakness (generalized): Secondary | ICD-10-CM | POA: Diagnosis not present

## 2022-09-17 DIAGNOSIS — M25572 Pain in left ankle and joints of left foot: Secondary | ICD-10-CM | POA: Diagnosis not present

## 2022-09-17 DIAGNOSIS — M722 Plantar fascial fibromatosis: Secondary | ICD-10-CM | POA: Diagnosis not present

## 2022-09-24 DIAGNOSIS — M25572 Pain in left ankle and joints of left foot: Secondary | ICD-10-CM | POA: Diagnosis not present

## 2022-09-29 DIAGNOSIS — E559 Vitamin D deficiency, unspecified: Secondary | ICD-10-CM | POA: Diagnosis not present

## 2022-09-29 DIAGNOSIS — N1831 Chronic kidney disease, stage 3a: Secondary | ICD-10-CM | POA: Diagnosis not present

## 2022-09-29 DIAGNOSIS — E1142 Type 2 diabetes mellitus with diabetic polyneuropathy: Secondary | ICD-10-CM | POA: Diagnosis not present

## 2022-09-29 DIAGNOSIS — G40909 Epilepsy, unspecified, not intractable, without status epilepticus: Secondary | ICD-10-CM | POA: Diagnosis not present

## 2022-09-29 DIAGNOSIS — E1121 Type 2 diabetes mellitus with diabetic nephropathy: Secondary | ICD-10-CM | POA: Diagnosis not present

## 2022-09-29 DIAGNOSIS — K219 Gastro-esophageal reflux disease without esophagitis: Secondary | ICD-10-CM | POA: Diagnosis not present

## 2022-09-29 DIAGNOSIS — E1122 Type 2 diabetes mellitus with diabetic chronic kidney disease: Secondary | ICD-10-CM | POA: Diagnosis not present

## 2022-09-29 DIAGNOSIS — Z8546 Personal history of malignant neoplasm of prostate: Secondary | ICD-10-CM | POA: Diagnosis not present

## 2022-09-29 DIAGNOSIS — E782 Mixed hyperlipidemia: Secondary | ICD-10-CM | POA: Diagnosis not present

## 2022-09-29 DIAGNOSIS — N2581 Secondary hyperparathyroidism of renal origin: Secondary | ICD-10-CM | POA: Diagnosis not present

## 2022-10-05 DIAGNOSIS — C61 Malignant neoplasm of prostate: Secondary | ICD-10-CM | POA: Diagnosis not present

## 2022-10-08 DIAGNOSIS — M722 Plantar fascial fibromatosis: Secondary | ICD-10-CM | POA: Diagnosis not present

## 2022-10-12 DIAGNOSIS — C61 Malignant neoplasm of prostate: Secondary | ICD-10-CM | POA: Diagnosis not present

## 2022-10-12 DIAGNOSIS — N35012 Post-traumatic membranous urethral stricture: Secondary | ICD-10-CM | POA: Diagnosis not present

## 2022-10-19 DIAGNOSIS — E1122 Type 2 diabetes mellitus with diabetic chronic kidney disease: Secondary | ICD-10-CM | POA: Diagnosis not present

## 2022-10-19 DIAGNOSIS — M1990 Unspecified osteoarthritis, unspecified site: Secondary | ICD-10-CM | POA: Diagnosis not present

## 2022-10-19 DIAGNOSIS — R32 Unspecified urinary incontinence: Secondary | ICD-10-CM | POA: Diagnosis not present

## 2022-10-19 DIAGNOSIS — E785 Hyperlipidemia, unspecified: Secondary | ICD-10-CM | POA: Diagnosis not present

## 2022-10-19 DIAGNOSIS — K219 Gastro-esophageal reflux disease without esophagitis: Secondary | ICD-10-CM | POA: Diagnosis not present

## 2022-10-19 DIAGNOSIS — N1831 Chronic kidney disease, stage 3a: Secondary | ICD-10-CM | POA: Diagnosis not present

## 2022-10-19 DIAGNOSIS — E1142 Type 2 diabetes mellitus with diabetic polyneuropathy: Secondary | ICD-10-CM | POA: Diagnosis not present

## 2022-10-19 DIAGNOSIS — K5900 Constipation, unspecified: Secondary | ICD-10-CM | POA: Diagnosis not present

## 2022-10-19 DIAGNOSIS — N400 Enlarged prostate without lower urinary tract symptoms: Secondary | ICD-10-CM | POA: Diagnosis not present

## 2022-10-25 DIAGNOSIS — L570 Actinic keratosis: Secondary | ICD-10-CM | POA: Diagnosis not present

## 2022-10-25 DIAGNOSIS — Z1283 Encounter for screening for malignant neoplasm of skin: Secondary | ICD-10-CM | POA: Diagnosis not present

## 2022-10-25 DIAGNOSIS — X32XXXD Exposure to sunlight, subsequent encounter: Secondary | ICD-10-CM | POA: Diagnosis not present

## 2022-10-25 DIAGNOSIS — B078 Other viral warts: Secondary | ICD-10-CM | POA: Diagnosis not present

## 2022-10-25 DIAGNOSIS — Z08 Encounter for follow-up examination after completed treatment for malignant neoplasm: Secondary | ICD-10-CM | POA: Diagnosis not present

## 2022-10-25 DIAGNOSIS — Z85828 Personal history of other malignant neoplasm of skin: Secondary | ICD-10-CM | POA: Diagnosis not present

## 2022-11-02 DIAGNOSIS — R2689 Other abnormalities of gait and mobility: Secondary | ICD-10-CM | POA: Diagnosis not present

## 2022-11-02 DIAGNOSIS — M25579 Pain in unspecified ankle and joints of unspecified foot: Secondary | ICD-10-CM | POA: Diagnosis not present

## 2022-11-02 DIAGNOSIS — M6281 Muscle weakness (generalized): Secondary | ICD-10-CM | POA: Diagnosis not present

## 2022-11-04 DIAGNOSIS — M6281 Muscle weakness (generalized): Secondary | ICD-10-CM | POA: Diagnosis not present

## 2022-11-04 DIAGNOSIS — M25579 Pain in unspecified ankle and joints of unspecified foot: Secondary | ICD-10-CM | POA: Diagnosis not present

## 2022-11-04 DIAGNOSIS — R2689 Other abnormalities of gait and mobility: Secondary | ICD-10-CM | POA: Diagnosis not present

## 2022-11-08 DIAGNOSIS — M6281 Muscle weakness (generalized): Secondary | ICD-10-CM | POA: Diagnosis not present

## 2022-11-08 DIAGNOSIS — M25579 Pain in unspecified ankle and joints of unspecified foot: Secondary | ICD-10-CM | POA: Diagnosis not present

## 2022-11-08 DIAGNOSIS — R2689 Other abnormalities of gait and mobility: Secondary | ICD-10-CM | POA: Diagnosis not present

## 2022-11-16 DIAGNOSIS — M170 Bilateral primary osteoarthritis of knee: Secondary | ICD-10-CM | POA: Diagnosis not present

## 2022-11-29 DIAGNOSIS — M25579 Pain in unspecified ankle and joints of unspecified foot: Secondary | ICD-10-CM | POA: Diagnosis not present

## 2022-11-29 DIAGNOSIS — M6281 Muscle weakness (generalized): Secondary | ICD-10-CM | POA: Diagnosis not present

## 2022-11-29 DIAGNOSIS — R2689 Other abnormalities of gait and mobility: Secondary | ICD-10-CM | POA: Diagnosis not present

## 2022-11-30 DIAGNOSIS — M25552 Pain in left hip: Secondary | ICD-10-CM | POA: Diagnosis not present

## 2022-11-30 DIAGNOSIS — M1612 Unilateral primary osteoarthritis, left hip: Secondary | ICD-10-CM | POA: Diagnosis not present

## 2022-11-30 DIAGNOSIS — M7062 Trochanteric bursitis, left hip: Secondary | ICD-10-CM | POA: Diagnosis not present

## 2022-12-02 DIAGNOSIS — R2689 Other abnormalities of gait and mobility: Secondary | ICD-10-CM | POA: Diagnosis not present

## 2022-12-02 DIAGNOSIS — M6281 Muscle weakness (generalized): Secondary | ICD-10-CM | POA: Diagnosis not present

## 2022-12-02 DIAGNOSIS — M25579 Pain in unspecified ankle and joints of unspecified foot: Secondary | ICD-10-CM | POA: Diagnosis not present

## 2022-12-06 DIAGNOSIS — M25579 Pain in unspecified ankle and joints of unspecified foot: Secondary | ICD-10-CM | POA: Diagnosis not present

## 2022-12-06 DIAGNOSIS — M6281 Muscle weakness (generalized): Secondary | ICD-10-CM | POA: Diagnosis not present

## 2022-12-06 DIAGNOSIS — R2689 Other abnormalities of gait and mobility: Secondary | ICD-10-CM | POA: Diagnosis not present

## 2022-12-13 DIAGNOSIS — M6281 Muscle weakness (generalized): Secondary | ICD-10-CM | POA: Diagnosis not present

## 2022-12-13 DIAGNOSIS — M25579 Pain in unspecified ankle and joints of unspecified foot: Secondary | ICD-10-CM | POA: Diagnosis not present

## 2022-12-13 DIAGNOSIS — R2689 Other abnormalities of gait and mobility: Secondary | ICD-10-CM | POA: Diagnosis not present

## 2022-12-14 DIAGNOSIS — L820 Inflamed seborrheic keratosis: Secondary | ICD-10-CM | POA: Diagnosis not present

## 2022-12-14 DIAGNOSIS — L570 Actinic keratosis: Secondary | ICD-10-CM | POA: Diagnosis not present

## 2022-12-14 DIAGNOSIS — B078 Other viral warts: Secondary | ICD-10-CM | POA: Diagnosis not present

## 2022-12-14 DIAGNOSIS — X32XXXD Exposure to sunlight, subsequent encounter: Secondary | ICD-10-CM | POA: Diagnosis not present

## 2022-12-16 DIAGNOSIS — H25813 Combined forms of age-related cataract, bilateral: Secondary | ICD-10-CM | POA: Diagnosis not present

## 2022-12-16 DIAGNOSIS — H35361 Drusen (degenerative) of macula, right eye: Secondary | ICD-10-CM | POA: Diagnosis not present

## 2022-12-16 DIAGNOSIS — H43821 Vitreomacular adhesion, right eye: Secondary | ICD-10-CM | POA: Diagnosis not present

## 2022-12-16 DIAGNOSIS — H5310 Unspecified subjective visual disturbances: Secondary | ICD-10-CM | POA: Diagnosis not present

## 2022-12-16 DIAGNOSIS — E119 Type 2 diabetes mellitus without complications: Secondary | ICD-10-CM | POA: Diagnosis not present

## 2022-12-16 DIAGNOSIS — H52223 Regular astigmatism, bilateral: Secondary | ICD-10-CM | POA: Diagnosis not present

## 2022-12-23 DIAGNOSIS — R2689 Other abnormalities of gait and mobility: Secondary | ICD-10-CM | POA: Diagnosis not present

## 2022-12-23 DIAGNOSIS — M25579 Pain in unspecified ankle and joints of unspecified foot: Secondary | ICD-10-CM | POA: Diagnosis not present

## 2022-12-23 DIAGNOSIS — M6281 Muscle weakness (generalized): Secondary | ICD-10-CM | POA: Diagnosis not present

## 2022-12-29 DIAGNOSIS — M25579 Pain in unspecified ankle and joints of unspecified foot: Secondary | ICD-10-CM | POA: Diagnosis not present

## 2022-12-29 DIAGNOSIS — M6281 Muscle weakness (generalized): Secondary | ICD-10-CM | POA: Diagnosis not present

## 2022-12-29 DIAGNOSIS — R2689 Other abnormalities of gait and mobility: Secondary | ICD-10-CM | POA: Diagnosis not present

## 2023-01-10 DIAGNOSIS — M25579 Pain in unspecified ankle and joints of unspecified foot: Secondary | ICD-10-CM | POA: Diagnosis not present

## 2023-01-10 DIAGNOSIS — M6281 Muscle weakness (generalized): Secondary | ICD-10-CM | POA: Diagnosis not present

## 2023-01-10 DIAGNOSIS — R2689 Other abnormalities of gait and mobility: Secondary | ICD-10-CM | POA: Diagnosis not present

## 2023-02-03 DIAGNOSIS — R350 Frequency of micturition: Secondary | ICD-10-CM | POA: Diagnosis not present

## 2023-02-03 DIAGNOSIS — N35012 Post-traumatic membranous urethral stricture: Secondary | ICD-10-CM | POA: Diagnosis not present

## 2023-02-09 DIAGNOSIS — M4692 Unspecified inflammatory spondylopathy, cervical region: Secondary | ICD-10-CM | POA: Diagnosis not present

## 2023-02-20 DIAGNOSIS — J069 Acute upper respiratory infection, unspecified: Secondary | ICD-10-CM | POA: Diagnosis not present

## 2023-02-20 DIAGNOSIS — Z20822 Contact with and (suspected) exposure to covid-19: Secondary | ICD-10-CM | POA: Diagnosis not present

## 2023-02-26 DIAGNOSIS — R051 Acute cough: Secondary | ICD-10-CM | POA: Diagnosis not present

## 2023-02-26 DIAGNOSIS — J40 Bronchitis, not specified as acute or chronic: Secondary | ICD-10-CM | POA: Diagnosis not present

## 2023-03-14 DIAGNOSIS — M542 Cervicalgia: Secondary | ICD-10-CM | POA: Diagnosis not present

## 2023-03-14 DIAGNOSIS — M6281 Muscle weakness (generalized): Secondary | ICD-10-CM | POA: Diagnosis not present

## 2023-03-18 DIAGNOSIS — M6281 Muscle weakness (generalized): Secondary | ICD-10-CM | POA: Diagnosis not present

## 2023-03-18 DIAGNOSIS — M542 Cervicalgia: Secondary | ICD-10-CM | POA: Diagnosis not present

## 2023-03-24 DIAGNOSIS — M542 Cervicalgia: Secondary | ICD-10-CM | POA: Diagnosis not present

## 2023-03-24 DIAGNOSIS — M6281 Muscle weakness (generalized): Secondary | ICD-10-CM | POA: Diagnosis not present

## 2023-03-25 DIAGNOSIS — G40909 Epilepsy, unspecified, not intractable, without status epilepticus: Secondary | ICD-10-CM | POA: Diagnosis not present

## 2023-03-25 DIAGNOSIS — K219 Gastro-esophageal reflux disease without esophagitis: Secondary | ICD-10-CM | POA: Diagnosis not present

## 2023-03-25 DIAGNOSIS — E1122 Type 2 diabetes mellitus with diabetic chronic kidney disease: Secondary | ICD-10-CM | POA: Diagnosis not present

## 2023-03-25 DIAGNOSIS — Z1331 Encounter for screening for depression: Secondary | ICD-10-CM | POA: Diagnosis not present

## 2023-03-25 DIAGNOSIS — Z0000 Encounter for general adult medical examination without abnormal findings: Secondary | ICD-10-CM | POA: Diagnosis not present

## 2023-03-25 DIAGNOSIS — E559 Vitamin D deficiency, unspecified: Secondary | ICD-10-CM | POA: Diagnosis not present

## 2023-03-25 DIAGNOSIS — E1121 Type 2 diabetes mellitus with diabetic nephropathy: Secondary | ICD-10-CM | POA: Diagnosis not present

## 2023-03-25 DIAGNOSIS — E782 Mixed hyperlipidemia: Secondary | ICD-10-CM | POA: Diagnosis not present

## 2023-03-25 DIAGNOSIS — N1831 Chronic kidney disease, stage 3a: Secondary | ICD-10-CM | POA: Diagnosis not present

## 2023-03-25 DIAGNOSIS — K5904 Chronic idiopathic constipation: Secondary | ICD-10-CM | POA: Diagnosis not present

## 2023-03-30 DIAGNOSIS — M542 Cervicalgia: Secondary | ICD-10-CM | POA: Diagnosis not present

## 2023-03-30 DIAGNOSIS — M6281 Muscle weakness (generalized): Secondary | ICD-10-CM | POA: Diagnosis not present

## 2023-04-05 DIAGNOSIS — M6281 Muscle weakness (generalized): Secondary | ICD-10-CM | POA: Diagnosis not present

## 2023-04-05 DIAGNOSIS — M542 Cervicalgia: Secondary | ICD-10-CM | POA: Diagnosis not present

## 2023-04-11 DIAGNOSIS — M6281 Muscle weakness (generalized): Secondary | ICD-10-CM | POA: Diagnosis not present

## 2023-04-11 DIAGNOSIS — M542 Cervicalgia: Secondary | ICD-10-CM | POA: Diagnosis not present

## 2023-04-21 DIAGNOSIS — M542 Cervicalgia: Secondary | ICD-10-CM | POA: Diagnosis not present

## 2023-04-21 DIAGNOSIS — M6281 Muscle weakness (generalized): Secondary | ICD-10-CM | POA: Diagnosis not present

## 2023-04-26 DIAGNOSIS — M542 Cervicalgia: Secondary | ICD-10-CM | POA: Diagnosis not present

## 2023-04-26 DIAGNOSIS — M6281 Muscle weakness (generalized): Secondary | ICD-10-CM | POA: Diagnosis not present

## 2023-04-29 DIAGNOSIS — Z01818 Encounter for other preprocedural examination: Secondary | ICD-10-CM | POA: Diagnosis not present

## 2023-05-05 DIAGNOSIS — R262 Difficulty in walking, not elsewhere classified: Secondary | ICD-10-CM | POA: Diagnosis not present

## 2023-05-05 DIAGNOSIS — M1612 Unilateral primary osteoarthritis, left hip: Secondary | ICD-10-CM | POA: Diagnosis not present

## 2023-05-05 DIAGNOSIS — M25652 Stiffness of left hip, not elsewhere classified: Secondary | ICD-10-CM | POA: Diagnosis not present

## 2023-05-09 DIAGNOSIS — X32XXXD Exposure to sunlight, subsequent encounter: Secondary | ICD-10-CM | POA: Diagnosis not present

## 2023-05-09 DIAGNOSIS — L570 Actinic keratosis: Secondary | ICD-10-CM | POA: Diagnosis not present

## 2023-05-10 DIAGNOSIS — M1612 Unilateral primary osteoarthritis, left hip: Secondary | ICD-10-CM | POA: Diagnosis not present

## 2023-05-10 DIAGNOSIS — M25652 Stiffness of left hip, not elsewhere classified: Secondary | ICD-10-CM | POA: Diagnosis not present

## 2023-05-10 DIAGNOSIS — R262 Difficulty in walking, not elsewhere classified: Secondary | ICD-10-CM | POA: Diagnosis not present

## 2023-05-11 DIAGNOSIS — Z6828 Body mass index (BMI) 28.0-28.9, adult: Secondary | ICD-10-CM | POA: Diagnosis not present

## 2023-05-11 DIAGNOSIS — I4510 Unspecified right bundle-branch block: Secondary | ICD-10-CM | POA: Diagnosis not present

## 2023-05-11 DIAGNOSIS — I517 Cardiomegaly: Secondary | ICD-10-CM | POA: Diagnosis not present

## 2023-05-11 DIAGNOSIS — Z7689 Persons encountering health services in other specified circumstances: Secondary | ICD-10-CM | POA: Diagnosis not present

## 2023-05-11 DIAGNOSIS — G40909 Epilepsy, unspecified, not intractable, without status epilepticus: Secondary | ICD-10-CM | POA: Diagnosis not present

## 2023-05-11 DIAGNOSIS — I452 Bifascicular block: Secondary | ICD-10-CM | POA: Diagnosis not present

## 2023-05-16 ENCOUNTER — Encounter: Payer: Self-pay | Admitting: Neurology

## 2023-05-16 ENCOUNTER — Ambulatory Visit: Payer: Medicare PPO | Admitting: Neurology

## 2023-05-16 VITALS — BP 137/81 | HR 72 | Ht 73.0 in | Wt 212.5 lb

## 2023-05-16 DIAGNOSIS — R569 Unspecified convulsions: Secondary | ICD-10-CM

## 2023-05-16 DIAGNOSIS — R269 Unspecified abnormalities of gait and mobility: Secondary | ICD-10-CM | POA: Insufficient documentation

## 2023-05-16 MED ORDER — ALPRAZOLAM 0.5 MG PO TABS: ORAL_TABLET | ORAL | 0 refills | Status: DC

## 2023-05-16 MED ORDER — LEVETIRACETAM ER 500 MG PO TB24: 1000.0000 mg | ORAL_TABLET | Freq: Every day | ORAL | 3 refills | Status: DC
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
## 2023-05-17 ENCOUNTER — Telehealth: Payer: Self-pay | Admitting: Neurology

## 2023-05-17 NOTE — Telephone Encounter (Signed)
Ethlyn Gallery: [phone] exp. 05/17/23-07/16/23 sent to GI [phone]

## 2023-05-18 ENCOUNTER — Ambulatory Visit: Payer: Medicare PPO | Admitting: Neurology

## 2023-05-18 DIAGNOSIS — M1612 Unilateral primary osteoarthritis, left hip: Secondary | ICD-10-CM | POA: Diagnosis not present

## 2023-05-18 DIAGNOSIS — R569 Unspecified convulsions: Secondary | ICD-10-CM

## 2023-05-18 DIAGNOSIS — R262 Difficulty in walking, not elsewhere classified: Secondary | ICD-10-CM | POA: Diagnosis not present

## 2023-05-18 DIAGNOSIS — M25652 Stiffness of left hip, not elsewhere classified: Secondary | ICD-10-CM | POA: Diagnosis not present

## 2023-05-23 ENCOUNTER — Telehealth: Payer: Self-pay | Admitting: Neurology

## 2023-05-23 DIAGNOSIS — E1122 Type 2 diabetes mellitus with diabetic chronic kidney disease: Secondary | ICD-10-CM | POA: Diagnosis not present

## 2023-05-23 DIAGNOSIS — N1831 Chronic kidney disease, stage 3a: Secondary | ICD-10-CM | POA: Diagnosis not present

## 2023-05-23 NOTE — Telephone Encounter (Signed)
Pt said May 10, 2023 had a seizure because Keppra was too low.  Would  like a call from the nurse to go into more details   Can contact at: [phone]

## 2023-05-23 NOTE — Telephone Encounter (Signed)
-----   Message from [facility] sent at 05/23/2023  2:45 PM EST ----- Regarding: FW: Patient Complaint [name] can you call this patient and see what is going on.   Megan ----- Message ----- From: [name] Sent: 05/23/2023   2:26 PM EST To: Levert Feinstein, MD; Butch Penny, NP Subject: Patient Complaint                              Pt calling concerning Dr. Terrace Arabia; consequences of decision made seven years ago and has disabled me. Dealing with incorrect dosage of Keppra 750 mg and sent to General Practitioner for seven years.  May 10, 2023 had a seizure because medication was too low.  Dr. Terrace Arabia do not want for me to drive for 6 months. Have had this condition for 57 years and I know how my body reacts to the correct dosage. Would need  a call back to go into more details  Can contact at: [phone]

## 2023-05-23 NOTE — Telephone Encounter (Signed)
See phone note documentation  

## 2023-05-23 NOTE — Telephone Encounter (Signed)
I called patient, he understand no driving following his seizure is to protect him and people on the road,  MRI of the brain without contrast scheduled for June 08, 2023

## 2023-05-23 NOTE — Telephone Encounter (Signed)
Call to patient who reports being upset that at his last visit in 04/2023, Dr. Terrace Arabia stated he couldn't drive. I reviewed [place] driving law and that Dr. Terrace Arabia was advising of that. He also was upset that at his visit in 2015, Dr. Terrace Arabia didn't schedule a follow up. I reviewed office notes from 09/2013 and advised that her note stated to return to clinic as needed and to continue Keppra XR 750 mg nightly. He states he didn't read the visit summary but was compliant with medication. He reported being seizure free until Jan 2022 when he had a grand mal seizure and related it to the grief from losing 2 family members one day apart. He did not call us nor his PCP to report the seizure. He continued taking the Keppra XR 750 mg until 05/10/2023 when he had another grand mal seizure. He had an office visit on 05/16/23 with Dr. Terrace Arabia and his Keppra was increased to 1000 mg nightly and he reports compliance since then. I reviewed and provided education on seizure triggers, [place] driving laws, and medication compliance. Patient stated he swims at the [facility] and I advised to not swim alone, climb ladders, and we discussed other safety precautions with seizures. Patient verbalized understanding and is aware to call if he has any auras, episodes, or seizures. He is aware Dr. Terrace Arabia wanted to follow up in 6 months and he is scheduled with Shawnie Dapper, NP on 11/14/23. Patient was appreciative of call.

## 2023-06-05 DIAGNOSIS — R059 Cough, unspecified: Secondary | ICD-10-CM | POA: Diagnosis not present

## 2023-06-05 DIAGNOSIS — R0981 Nasal congestion: Secondary | ICD-10-CM | POA: Diagnosis not present

## 2023-06-08 ENCOUNTER — Other Ambulatory Visit: Payer: Medicare PPO

## 2023-06-15 ENCOUNTER — Telehealth: Payer: Self-pay | Admitting: Neurology

## 2023-06-15 ENCOUNTER — Encounter: Payer: Self-pay | Admitting: Neurology

## 2023-06-15 NOTE — Procedures (Signed)
   HISTORY: 72 year old male with history of recurrent nocturnal seizure  TECHNIQUE:  This is a routine 16 channel EEG recording with one channel devoted to a limited EKG recording.  It was performed during wakefulness, drowsiness and asleep.  Hyperventilation and photic stimulation were performed as activating procedures.  There are minimum muscle and movement artifact noted.  Upon maximum arousal, posterior dominant waking rhythm consistent of rhythmic alpha range activity. Activities are symmetric over the bilateral posterior derivations and attenuated with eye opening.  Photic stimulation did not alter the tracing.  Hyperventilation produced mild/moderate buildup with higher amplitude and the slower activities noted.  During EEG recording, patient developed drowsiness and no deeper stage of sleep was achieved  During EEG recording, there was no epileptiform discharge noted.  EKG demonstrated occasionally irregular heart rate  CONCLUSION: This is a  normal awake EEG.  There is no electrodiagnostic evidence of epileptiform discharge.  Levert Feinstein, M.D. Ph.D.  [facility] [address] [place], [place] [address] Phone: [phone] Fax:      [phone]

## 2023-06-15 NOTE — Telephone Encounter (Signed)
 I called patient, EEG was normal, but on the EKG date, there was occasionally skipping a beat, he was recently seen by primary care and cardiology for presurgical clearance for his hip replacement,

## 2023-06-16 DIAGNOSIS — J40 Bronchitis, not specified as acute or chronic: Secondary | ICD-10-CM | POA: Diagnosis not present

## 2023-06-16 DIAGNOSIS — R059 Cough, unspecified: Secondary | ICD-10-CM | POA: Diagnosis not present

## 2023-06-16 DIAGNOSIS — J329 Chronic sinusitis, unspecified: Secondary | ICD-10-CM | POA: Diagnosis not present

## 2023-06-17 ENCOUNTER — Encounter: Payer: Self-pay | Admitting: Neurology

## 2023-06-19 ENCOUNTER — Ambulatory Visit
Admission: RE | Admit: 2023-06-19 | Discharge: 2023-06-19 | Disposition: A | Discharge status: Home / Self Care | Payer: Medicare PPO | Source: Ambulatory Visit | Attending: Neurology | Admitting: Neurology

## 2023-06-19 DIAGNOSIS — R569 Unspecified convulsions: Secondary | ICD-10-CM

## 2023-06-21 ENCOUNTER — Encounter: Payer: Self-pay | Admitting: Neurology

## 2023-06-29 DIAGNOSIS — Z111 Encounter for screening for respiratory tuberculosis: Secondary | ICD-10-CM | POA: Diagnosis not present

## 2023-07-18 DIAGNOSIS — N1831 Chronic kidney disease, stage 3a: Secondary | ICD-10-CM | POA: Diagnosis not present

## 2023-07-18 DIAGNOSIS — Z833 Family history of diabetes mellitus: Secondary | ICD-10-CM | POA: Diagnosis not present

## 2023-07-18 DIAGNOSIS — Z809 Family history of malignant neoplasm, unspecified: Secondary | ICD-10-CM | POA: Diagnosis not present

## 2023-07-18 DIAGNOSIS — E785 Hyperlipidemia, unspecified: Secondary | ICD-10-CM | POA: Diagnosis not present

## 2023-07-20 DIAGNOSIS — M25562 Pain in left knee: Secondary | ICD-10-CM | POA: Diagnosis not present

## 2023-07-20 DIAGNOSIS — H353113 Nonexudative age-related macular degeneration, right eye, advanced atrophic without subfoveal involvement: Secondary | ICD-10-CM | POA: Diagnosis not present

## 2023-07-25 DIAGNOSIS — M76829 Posterior tibial tendinitis, unspecified leg: Secondary | ICD-10-CM | POA: Diagnosis not present

## 2023-07-25 DIAGNOSIS — E663 Overweight: Secondary | ICD-10-CM | POA: Diagnosis not present

## 2023-07-25 DIAGNOSIS — M1990 Unspecified osteoarthritis, unspecified site: Secondary | ICD-10-CM | POA: Diagnosis not present

## 2023-07-25 DIAGNOSIS — K219 Gastro-esophageal reflux disease without esophagitis: Secondary | ICD-10-CM | POA: Diagnosis not present

## 2023-07-25 DIAGNOSIS — K5900 Constipation, unspecified: Secondary | ICD-10-CM | POA: Diagnosis not present

## 2023-07-25 DIAGNOSIS — E785 Hyperlipidemia, unspecified: Secondary | ICD-10-CM | POA: Diagnosis not present

## 2023-07-25 DIAGNOSIS — Z0000 Encounter for general adult medical examination without abnormal findings: Secondary | ICD-10-CM | POA: Diagnosis not present

## 2023-07-25 DIAGNOSIS — G40909 Epilepsy, unspecified, not intractable, without status epilepticus: Secondary | ICD-10-CM | POA: Diagnosis not present

## 2023-07-25 DIAGNOSIS — C61 Malignant neoplasm of prostate: Secondary | ICD-10-CM | POA: Diagnosis not present

## 2023-07-26 DIAGNOSIS — M25562 Pain in left knee: Secondary | ICD-10-CM | POA: Diagnosis not present

## 2023-07-26 DIAGNOSIS — R209 Unspecified disturbances of skin sensation: Secondary | ICD-10-CM | POA: Diagnosis not present

## 2023-08-02 DIAGNOSIS — M25562 Pain in left knee: Secondary | ICD-10-CM | POA: Diagnosis not present

## 2023-08-04 ENCOUNTER — Telehealth: Payer: Self-pay | Admitting: Neurology

## 2023-08-04 DIAGNOSIS — R569 Unspecified convulsions: Secondary | ICD-10-CM

## 2023-08-04 DIAGNOSIS — N1831 Chronic kidney disease, stage 3a: Secondary | ICD-10-CM | POA: Diagnosis not present

## 2023-08-04 DIAGNOSIS — F8081 Childhood onset fluency disorder: Secondary | ICD-10-CM | POA: Diagnosis not present

## 2023-08-04 DIAGNOSIS — R4789 Other speech disturbances: Secondary | ICD-10-CM | POA: Diagnosis not present

## 2023-08-04 DIAGNOSIS — R269 Unspecified abnormalities of gait and mobility: Secondary | ICD-10-CM

## 2023-08-04 DIAGNOSIS — G40909 Epilepsy, unspecified, not intractable, without status epilepticus: Secondary | ICD-10-CM | POA: Diagnosis not present

## 2023-08-04 DIAGNOSIS — E1165 Type 2 diabetes mellitus with hyperglycemia: Secondary | ICD-10-CM | POA: Diagnosis not present

## 2023-08-04 NOTE — Telephone Encounter (Signed)
 Call to patient and he states that at visit with PCP they discussed realizing he is going to say something and it not make sense and then he catches him self and able to form the right words to verbalize his thoughts. This  has only happened since the increase in Keppra in January and is wondering if this could be a result of the increase. Patient would like to have Keppra levels checked and states he has never had reaction or side effects from medications.

## 2023-08-04 NOTE — Telephone Encounter (Signed)
 Pt called stating that his PCP recommended him to come back to Neuro to check on the levETIRAcetam (KEPPRA XR) 500 MG 24 hr tablet dosage. Pt states other problems have arise and it may be because of the Keppra. Please advise.

## 2023-08-08 NOTE — Telephone Encounter (Signed)
 We also received an urgent referral from this patients PCP for a follow up with Dr Gracie Lav for these symptoms.

## 2023-08-08 NOTE — Telephone Encounter (Signed)
 Call to patient, he will come 4/22 for labs and is aware they will hopefully be back in time for appointment Thursday.

## 2023-08-08 NOTE — Telephone Encounter (Signed)
 Orders Placed This Encounter  Procedures   Levetiracetam  level

## 2023-08-09 ENCOUNTER — Other Ambulatory Visit (INDEPENDENT_AMBULATORY_CARE_PROVIDER_SITE_OTHER): Payer: Self-pay

## 2023-08-09 ENCOUNTER — Other Ambulatory Visit: Payer: Self-pay

## 2023-08-09 DIAGNOSIS — R269 Unspecified abnormalities of gait and mobility: Secondary | ICD-10-CM | POA: Diagnosis not present

## 2023-08-09 DIAGNOSIS — M1712 Unilateral primary osteoarthritis, left knee: Secondary | ICD-10-CM | POA: Diagnosis not present

## 2023-08-09 DIAGNOSIS — R569 Unspecified convulsions: Secondary | ICD-10-CM | POA: Diagnosis not present

## 2023-08-09 DIAGNOSIS — Z0289 Encounter for other administrative examinations: Secondary | ICD-10-CM

## 2023-08-10 ENCOUNTER — Encounter: Payer: Self-pay | Admitting: Neurology

## 2023-08-10 LAB — LEVETIRACETAM LEVEL: Levetiracetam Lvl: 14.5 ug/mL (ref 10.0–40.0)

## 2023-08-11 ENCOUNTER — Ambulatory Visit: Admitting: Neurology

## 2023-08-11 ENCOUNTER — Encounter: Payer: Self-pay | Admitting: Neurology

## 2023-08-11 VITALS — BP 139/87 | HR 78 | Ht 73.0 in | Wt 208.0 lb

## 2023-08-11 DIAGNOSIS — M1712 Unilateral primary osteoarthritis, left knee: Secondary | ICD-10-CM | POA: Diagnosis not present

## 2023-08-11 DIAGNOSIS — R569 Unspecified convulsions: Secondary | ICD-10-CM

## 2023-08-11 DIAGNOSIS — F8081 Childhood onset fluency disorder: Secondary | ICD-10-CM | POA: Diagnosis not present

## 2023-08-11 DIAGNOSIS — E7800 Pure hypercholesterolemia, unspecified: Secondary | ICD-10-CM | POA: Diagnosis not present

## 2023-08-11 DIAGNOSIS — E119 Type 2 diabetes mellitus without complications: Secondary | ICD-10-CM | POA: Diagnosis not present

## 2023-08-11 DIAGNOSIS — M25561 Pain in right knee: Secondary | ICD-10-CM | POA: Diagnosis not present

## 2023-08-11 DIAGNOSIS — M25562 Pain in left knee: Secondary | ICD-10-CM | POA: Diagnosis not present
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
## 2023-08-12 DIAGNOSIS — X32XXXD Exposure to sunlight, subsequent encounter: Secondary | ICD-10-CM | POA: Diagnosis not present

## 2023-08-12 DIAGNOSIS — L570 Actinic keratosis: Secondary | ICD-10-CM | POA: Diagnosis not present

## 2023-08-15 DIAGNOSIS — F8081 Childhood onset fluency disorder: Secondary | ICD-10-CM | POA: Insufficient documentation

## 2023-08-30 DIAGNOSIS — M1712 Unilateral primary osteoarthritis, left knee: Secondary | ICD-10-CM | POA: Diagnosis not present

## 2023-08-30 DIAGNOSIS — M1612 Unilateral primary osteoarthritis, left hip: Secondary | ICD-10-CM | POA: Diagnosis not present

## 2023-09-05 DIAGNOSIS — E1121 Type 2 diabetes mellitus with diabetic nephropathy: Secondary | ICD-10-CM | POA: Diagnosis not present

## 2023-09-05 DIAGNOSIS — E7800 Pure hypercholesterolemia, unspecified: Secondary | ICD-10-CM | POA: Diagnosis not present

## 2023-09-05 DIAGNOSIS — N1831 Chronic kidney disease, stage 3a: Secondary | ICD-10-CM | POA: Diagnosis not present

## 2023-09-05 DIAGNOSIS — M1612 Unilateral primary osteoarthritis, left hip: Secondary | ICD-10-CM | POA: Diagnosis not present

## 2023-09-06 ENCOUNTER — Telehealth: Payer: Self-pay | Admitting: Neurology

## 2023-09-06 NOTE — Telephone Encounter (Signed)
 [facility] [address] [facility]) Requesting surgical clearance for left total knee replacement on 11/11/23. Will be faxing a surgical clearance form to be faxed to [phone]

## 2023-09-26 DIAGNOSIS — M79641 Pain in right hand: Secondary | ICD-10-CM | POA: Diagnosis not present

## 2023-10-03 DIAGNOSIS — E1122 Type 2 diabetes mellitus with diabetic chronic kidney disease: Secondary | ICD-10-CM | POA: Diagnosis not present

## 2023-10-03 DIAGNOSIS — N1831 Chronic kidney disease, stage 3a: Secondary | ICD-10-CM | POA: Diagnosis not present

## 2023-10-04 DIAGNOSIS — C61 Malignant neoplasm of prostate: Secondary | ICD-10-CM | POA: Diagnosis not present

## 2023-10-11 DIAGNOSIS — N35012 Post-traumatic membranous urethral stricture: Secondary | ICD-10-CM | POA: Diagnosis not present

## 2023-10-11 DIAGNOSIS — C61 Malignant neoplasm of prostate: Secondary | ICD-10-CM | POA: Diagnosis not present

## 2023-10-17 DIAGNOSIS — E1121 Type 2 diabetes mellitus with diabetic nephropathy: Secondary | ICD-10-CM | POA: Diagnosis not present

## 2023-10-17 DIAGNOSIS — Z8546 Personal history of malignant neoplasm of prostate: Secondary | ICD-10-CM | POA: Diagnosis not present

## 2023-10-17 DIAGNOSIS — N1831 Chronic kidney disease, stage 3a: Secondary | ICD-10-CM | POA: Diagnosis not present

## 2023-10-17 DIAGNOSIS — E782 Mixed hyperlipidemia: Secondary | ICD-10-CM | POA: Diagnosis not present

## 2023-10-18 DIAGNOSIS — J3489 Other specified disorders of nose and nasal sinuses: Secondary | ICD-10-CM | POA: Diagnosis not present

## 2023-10-18 DIAGNOSIS — H109 Unspecified conjunctivitis: Secondary | ICD-10-CM | POA: Diagnosis not present

## 2023-10-24 DIAGNOSIS — M1612 Unilateral primary osteoarthritis, left hip: Secondary | ICD-10-CM | POA: Diagnosis not present

## 2023-11-02 DIAGNOSIS — Z96642 Presence of left artificial hip joint: Secondary | ICD-10-CM | POA: Diagnosis not present

## 2023-11-02 DIAGNOSIS — M1612 Unilateral primary osteoarthritis, left hip: Secondary | ICD-10-CM | POA: Diagnosis not present

## 2023-11-03 DIAGNOSIS — M6281 Muscle weakness (generalized): Secondary | ICD-10-CM | POA: Diagnosis not present

## 2023-11-03 DIAGNOSIS — Z96642 Presence of left artificial hip joint: Secondary | ICD-10-CM | POA: Diagnosis not present

## 2023-11-03 DIAGNOSIS — M25552 Pain in left hip: Secondary | ICD-10-CM | POA: Diagnosis not present

## 2023-11-07 DIAGNOSIS — Z96642 Presence of left artificial hip joint: Secondary | ICD-10-CM | POA: Diagnosis not present

## 2023-11-07 DIAGNOSIS — M25552 Pain in left hip: Secondary | ICD-10-CM | POA: Diagnosis not present

## 2023-11-07 DIAGNOSIS — M6281 Muscle weakness (generalized): Secondary | ICD-10-CM | POA: Diagnosis not present

## 2023-11-09 DIAGNOSIS — M6281 Muscle weakness (generalized): Secondary | ICD-10-CM | POA: Diagnosis not present

## 2023-11-09 DIAGNOSIS — Z96642 Presence of left artificial hip joint: Secondary | ICD-10-CM | POA: Diagnosis not present

## 2023-11-09 DIAGNOSIS — M25552 Pain in left hip: Secondary | ICD-10-CM | POA: Diagnosis not present

## 2023-11-10 DIAGNOSIS — M1612 Unilateral primary osteoarthritis, left hip: Secondary | ICD-10-CM | POA: Diagnosis not present

## 2023-11-11 DIAGNOSIS — M25552 Pain in left hip: Secondary | ICD-10-CM | POA: Diagnosis not present

## 2023-11-11 DIAGNOSIS — M6281 Muscle weakness (generalized): Secondary | ICD-10-CM | POA: Diagnosis not present

## 2023-11-11 DIAGNOSIS — Z96642 Presence of left artificial hip joint: Secondary | ICD-10-CM | POA: Diagnosis not present

## 2023-11-14 ENCOUNTER — Telehealth: Payer: Medicare PPO | Admitting: Family Medicine

## 2023-11-16 DIAGNOSIS — H43391 Other vitreous opacities, right eye: Secondary | ICD-10-CM | POA: Diagnosis not present

## 2023-11-16 DIAGNOSIS — M6281 Muscle weakness (generalized): Secondary | ICD-10-CM | POA: Diagnosis not present

## 2023-11-16 DIAGNOSIS — M25552 Pain in left hip: Secondary | ICD-10-CM | POA: Diagnosis not present

## 2023-11-16 DIAGNOSIS — Z96642 Presence of left artificial hip joint: Secondary | ICD-10-CM | POA: Diagnosis not present

## 2023-11-17 DIAGNOSIS — L570 Actinic keratosis: Secondary | ICD-10-CM | POA: Diagnosis not present

## 2023-11-17 DIAGNOSIS — D225 Melanocytic nevi of trunk: Secondary | ICD-10-CM | POA: Diagnosis not present

## 2023-11-17 DIAGNOSIS — Z8546 Personal history of malignant neoplasm of prostate: Secondary | ICD-10-CM | POA: Diagnosis not present

## 2023-11-17 DIAGNOSIS — E782 Mixed hyperlipidemia: Secondary | ICD-10-CM | POA: Diagnosis not present

## 2023-11-17 DIAGNOSIS — Z1283 Encounter for screening for malignant neoplasm of skin: Secondary | ICD-10-CM | POA: Diagnosis not present

## 2023-11-17 DIAGNOSIS — X32XXXD Exposure to sunlight, subsequent encounter: Secondary | ICD-10-CM | POA: Diagnosis not present

## 2023-11-17 DIAGNOSIS — N1831 Chronic kidney disease, stage 3a: Secondary | ICD-10-CM | POA: Diagnosis not present

## 2023-11-17 DIAGNOSIS — E1121 Type 2 diabetes mellitus with diabetic nephropathy: Secondary | ICD-10-CM | POA: Diagnosis not present

## 2023-11-18 DIAGNOSIS — M6281 Muscle weakness (generalized): Secondary | ICD-10-CM | POA: Diagnosis not present

## 2023-11-18 DIAGNOSIS — Z96642 Presence of left artificial hip joint: Secondary | ICD-10-CM | POA: Diagnosis not present

## 2023-11-18 DIAGNOSIS — M25552 Pain in left hip: Secondary | ICD-10-CM | POA: Diagnosis not present

## 2023-11-21 DIAGNOSIS — M6281 Muscle weakness (generalized): Secondary | ICD-10-CM | POA: Diagnosis not present

## 2023-11-21 DIAGNOSIS — Z96642 Presence of left artificial hip joint: Secondary | ICD-10-CM | POA: Diagnosis not present

## 2023-11-21 DIAGNOSIS — M25552 Pain in left hip: Secondary | ICD-10-CM | POA: Diagnosis not present

## 2023-11-23 DIAGNOSIS — M25552 Pain in left hip: Secondary | ICD-10-CM | POA: Diagnosis not present

## 2023-11-23 DIAGNOSIS — Z96642 Presence of left artificial hip joint: Secondary | ICD-10-CM | POA: Diagnosis not present

## 2023-11-23 DIAGNOSIS — M6281 Muscle weakness (generalized): Secondary | ICD-10-CM | POA: Diagnosis not present

## 2023-11-25 DIAGNOSIS — M25552 Pain in left hip: Secondary | ICD-10-CM | POA: Diagnosis not present

## 2023-11-25 DIAGNOSIS — M6281 Muscle weakness (generalized): Secondary | ICD-10-CM | POA: Diagnosis not present

## 2023-11-25 DIAGNOSIS — Z96642 Presence of left artificial hip joint: Secondary | ICD-10-CM | POA: Diagnosis not present

## 2023-11-29 DIAGNOSIS — M25552 Pain in left hip: Secondary | ICD-10-CM | POA: Diagnosis not present

## 2023-12-08 DIAGNOSIS — H52223 Regular astigmatism, bilateral: Secondary | ICD-10-CM | POA: Diagnosis not present

## 2023-12-08 DIAGNOSIS — E119 Type 2 diabetes mellitus without complications: Secondary | ICD-10-CM | POA: Diagnosis not present

## 2023-12-08 DIAGNOSIS — H5310 Unspecified subjective visual disturbances: Secondary | ICD-10-CM | POA: Diagnosis not present

## 2023-12-08 DIAGNOSIS — H35361 Drusen (degenerative) of macula, right eye: Secondary | ICD-10-CM | POA: Diagnosis not present

## 2023-12-08 DIAGNOSIS — H25813 Combined forms of age-related cataract, bilateral: Secondary | ICD-10-CM | POA: Diagnosis not present

## 2023-12-08 DIAGNOSIS — H43821 Vitreomacular adhesion, right eye: Secondary | ICD-10-CM | POA: Diagnosis not present

## 2023-12-15 DIAGNOSIS — M79641 Pain in right hand: Secondary | ICD-10-CM | POA: Diagnosis not present

## 2023-12-18 DIAGNOSIS — E782 Mixed hyperlipidemia: Secondary | ICD-10-CM | POA: Diagnosis not present

## 2023-12-18 DIAGNOSIS — Z8546 Personal history of malignant neoplasm of prostate: Secondary | ICD-10-CM | POA: Diagnosis not present

## 2023-12-18 DIAGNOSIS — E1121 Type 2 diabetes mellitus with diabetic nephropathy: Secondary | ICD-10-CM | POA: Diagnosis not present

## 2023-12-18 DIAGNOSIS — N1831 Chronic kidney disease, stage 3a: Secondary | ICD-10-CM | POA: Diagnosis not present

## 2024-01-03 DIAGNOSIS — M79641 Pain in right hand: Secondary | ICD-10-CM | POA: Diagnosis not present

## 2024-01-10 DIAGNOSIS — M79641 Pain in right hand: Secondary | ICD-10-CM | POA: Diagnosis not present

## 2024-01-17 DIAGNOSIS — Z8546 Personal history of malignant neoplasm of prostate: Secondary | ICD-10-CM | POA: Diagnosis not present

## 2024-01-17 DIAGNOSIS — E1121 Type 2 diabetes mellitus with diabetic nephropathy: Secondary | ICD-10-CM | POA: Diagnosis not present

## 2024-01-17 DIAGNOSIS — E782 Mixed hyperlipidemia: Secondary | ICD-10-CM | POA: Diagnosis not present

## 2024-01-17 DIAGNOSIS — N1831 Chronic kidney disease, stage 3a: Secondary | ICD-10-CM | POA: Diagnosis not present

## 2024-01-23 DIAGNOSIS — R262 Difficulty in walking, not elsewhere classified: Secondary | ICD-10-CM | POA: Diagnosis not present

## 2024-01-23 DIAGNOSIS — M6281 Muscle weakness (generalized): Secondary | ICD-10-CM | POA: Diagnosis not present

## 2024-01-23 DIAGNOSIS — M25552 Pain in left hip: Secondary | ICD-10-CM | POA: Diagnosis not present

## 2024-01-24 DIAGNOSIS — E663 Overweight: Secondary | ICD-10-CM | POA: Diagnosis not present

## 2024-01-24 DIAGNOSIS — M2540 Effusion, unspecified joint: Secondary | ICD-10-CM | POA: Diagnosis not present

## 2024-01-24 DIAGNOSIS — M79642 Pain in left hand: Secondary | ICD-10-CM | POA: Diagnosis not present

## 2024-01-24 DIAGNOSIS — M0579 Rheumatoid arthritis with rheumatoid factor of multiple sites without organ or systems involvement: Secondary | ICD-10-CM | POA: Diagnosis not present

## 2024-01-24 DIAGNOSIS — Z6828 Body mass index (BMI) 28.0-28.9, adult: Secondary | ICD-10-CM | POA: Diagnosis not present

## 2024-01-24 DIAGNOSIS — M79641 Pain in right hand: Secondary | ICD-10-CM | POA: Diagnosis not present

## 2024-01-26 DIAGNOSIS — R262 Difficulty in walking, not elsewhere classified: Secondary | ICD-10-CM | POA: Diagnosis not present

## 2024-01-26 DIAGNOSIS — N1831 Chronic kidney disease, stage 3a: Secondary | ICD-10-CM | POA: Diagnosis not present

## 2024-01-26 DIAGNOSIS — M25552 Pain in left hip: Secondary | ICD-10-CM | POA: Diagnosis not present

## 2024-01-26 DIAGNOSIS — M6281 Muscle weakness (generalized): Secondary | ICD-10-CM | POA: Diagnosis not present

## 2024-01-31 DIAGNOSIS — N1831 Chronic kidney disease, stage 3a: Secondary | ICD-10-CM | POA: Diagnosis not present

## 2024-01-31 DIAGNOSIS — M6281 Muscle weakness (generalized): Secondary | ICD-10-CM | POA: Diagnosis not present

## 2024-01-31 DIAGNOSIS — D638 Anemia in other chronic diseases classified elsewhere: Secondary | ICD-10-CM | POA: Diagnosis not present

## 2024-01-31 DIAGNOSIS — N2581 Secondary hyperparathyroidism of renal origin: Secondary | ICD-10-CM | POA: Diagnosis not present

## 2024-01-31 DIAGNOSIS — M25552 Pain in left hip: Secondary | ICD-10-CM | POA: Diagnosis not present

## 2024-01-31 DIAGNOSIS — E1122 Type 2 diabetes mellitus with diabetic chronic kidney disease: Secondary | ICD-10-CM | POA: Diagnosis not present

## 2024-01-31 DIAGNOSIS — R262 Difficulty in walking, not elsewhere classified: Secondary | ICD-10-CM | POA: Diagnosis not present

## 2024-02-06 DIAGNOSIS — M25511 Pain in right shoulder: Secondary | ICD-10-CM | POA: Insufficient documentation

## 2024-02-06 DIAGNOSIS — M7501 Adhesive capsulitis of right shoulder: Secondary | ICD-10-CM | POA: Insufficient documentation

## 2024-02-07 DIAGNOSIS — M6281 Muscle weakness (generalized): Secondary | ICD-10-CM | POA: Diagnosis not present

## 2024-02-07 DIAGNOSIS — R262 Difficulty in walking, not elsewhere classified: Secondary | ICD-10-CM | POA: Diagnosis not present

## 2024-02-07 DIAGNOSIS — M25552 Pain in left hip: Secondary | ICD-10-CM | POA: Diagnosis not present

## 2024-02-17 DIAGNOSIS — Z8546 Personal history of malignant neoplasm of prostate: Secondary | ICD-10-CM | POA: Diagnosis not present

## 2024-02-17 DIAGNOSIS — N1831 Chronic kidney disease, stage 3a: Secondary | ICD-10-CM | POA: Diagnosis not present

## 2024-02-17 DIAGNOSIS — E1121 Type 2 diabetes mellitus with diabetic nephropathy: Secondary | ICD-10-CM | POA: Diagnosis not present

## 2024-02-17 DIAGNOSIS — E782 Mixed hyperlipidemia: Secondary | ICD-10-CM | POA: Diagnosis not present

## 2024-02-25 ENCOUNTER — Telehealth: Admitting: Family Medicine

## 2024-02-25 DIAGNOSIS — K219 Gastro-esophageal reflux disease without esophagitis: Secondary | ICD-10-CM

## 2024-02-25 MED ORDER — FAMOTIDINE 20 MG PO TABS: 20.0000 mg | ORAL_TABLET | Freq: Every day | ORAL | 0 refills | Status: AC

## 2024-02-25 NOTE — Progress Notes (Signed)
 Virtual Visit Consent   [name], you are scheduled for a virtual visit with a [facility] provider today. Just as with appointments in the office, your consent must be obtained to participate. Your consent will be active for this visit and any virtual visit you may have with one of our providers in the next 365 days. If you have a MyChart account, a copy of this consent can be sent to you electronically.  As this is a virtual visit, video technology does not allow for your provider to perform a traditional examination. This may limit your provider's ability to fully assess your condition. If your provider identifies any concerns that need to be evaluated in person or the need to arrange testing (such as labs, EKG, etc.), we will make arrangements to do so. Although advances in technology are sophisticated, we cannot ensure that it will always work on either your end or our end. If the connection with a video visit is poor, the visit may have to be switched to a telephone visit. With either a video or telephone visit, we are not always able to ensure that we have a secure connection.  By engaging in this virtual visit, you consent to the provision of healthcare and authorize for your insurance to be billed (if applicable) for the services provided during this visit. Depending on your insurance coverage, you may receive a charge related to this service.  I need to obtain your verbal consent now. Are you willing to proceed with your visit today? [name] has provided verbal consent on 02/25/2024 for a virtual visit (video or telephone). [name], [place]  Date: 02/25/2024 6:06 PM   Virtual Visit via Video Note   I, [name], connected with  GALILEO COLELLO  ([id number], [birth year]) on 02/25/24 at  6:00 PM EST by a video-enabled telemedicine application and verified that I am speaking with the correct person using two identifiers.  Location: Patient: Virtual Visit Location  Patient: Home Provider: Virtual Visit Location Provider: Home Office   I discussed the limitations of evaluation and management by telemedicine and the availability of in person appointments. The patient expressed understanding and agreed to proceed.    History of Present Illness: [name] is a 73 y.o. who identifies as a male who was assigned male at birth, and is being seen today for c/o on Wednesday after lunch he felt very full and burning in stomach that has not gone away.  Pt states it goes away when he lays down but feels like its coming up.  Pt read online that he has stomach ulcer. Pt denies vomiting blood or blood in stool. Pt states he takes Dexilent for acid reflux. Pt states he takes dexilent once a day.   HPI: HPI  Problems:  Patient Active Problem List   Diagnosis Date Noted   Stuttering 08/15/2023   Gait abnormality 05/16/2023   Primary osteoarthritis of right hip 10/19/2021   S/P total right hip arthroplasty 10/19/2021   Family history of prostate cancer 02/03/2021   Knee pain 02/03/2021   Hand joint pain 02/07/2018   Osteoarthritis of right hand 02/07/2018   Urinary retention 01/17/2018   Routine general medical examination at a health care facility 12/02/2017   Malignant neoplasm of prostate (HCC) 10/18/2017   Acquired pes planus of left foot 09/03/2014   Acquired posterior equinus of left lower extremity 09/03/2014   Posterior tibial tendon dysfunction 09/03/2014   Seizures (HCC) 10/09/2012   Osteoarthritis  of left shoulder 10/09/2012    Allergies:  Allergies  Allergen Reactions   Lansoprazole     Did not help  Other Reaction(s): Did not help  Other reaction(s): Did not help  Other reaction(s): Did not help  Other reaction(s): Did not help    Other reaction(s): Did not help Other reaction(s): Did not help     Did not help   Medications:  Current Outpatient Medications:    famotidine  (PEPCID ) 20 MG tablet, Take 1 tablet (20 mg total) by  mouth at bedtime., Disp: 30 tablet, Rfl: 0   cholecalciferol (VITAMIN D3) 25 MCG (1000 UNIT) tablet, Take 2,000-3,000 Units by mouth See admin instructions. 2000 units in the morning, 3000 units at bedtime, Disp: , Rfl:    dexlansoprazole (DEXILANT) 60 MG capsule, Take 60 mg by mouth daily., Disp: , Rfl:    fenofibrate  micronized (LOFIBRA) 67 MG capsule, Take 67 mg by mouth daily before breakfast., Disp: , Rfl:    folic acid (FOLVITE) 800 MCG tablet, Take 800 mcg by mouth daily., Disp: , Rfl:    levETIRAcetam  (KEPPRA  XR) 500 MG 24 hr tablet, Take 2 tablets (1,000 mg total) by mouth daily., Disp: 180 tablet, Rfl: 3   lubiprostone  (AMITIZA ) 24 MCG capsule, Take 24 mcg by mouth 2 (two) times daily with a meal., Disp: , Rfl:    metFORMIN  (GLUCOPHAGE ) 500 MG tablet, Take 500 mg by mouth 2 (two) times daily with a meal., Disp: , Rfl:    Multiple Vitamins-Minerals (CENTRUM SILVER 50+MEN PO), Take 1 tablet by mouth daily., Disp: , Rfl:    Multiple Vitamins-Minerals (PRESERVISION AREDS PO), Take 1 capsule by mouth 2 (two) times daily., Disp: , Rfl:    simvastatin  (ZOCOR ) 40 MG tablet, Take 40 mg by mouth at bedtime., Disp: , Rfl:   Observations/Objective: Patient is well-developed, well-nourished in no acute distress.  Resting comfortably at home.  Head is normocephalic, atraumatic.  No labored breathing.  Speech is clear and coherent with logical content.  Patient is alert and oriented at baseline.    Assessment and Plan: 1. Gastroesophageal reflux disease, unspecified whether esophagitis present (Primary) - famotidine  (PEPCID ) 20 MG tablet; Take 1 tablet (20 mg total) by mouth at bedtime.  Dispense: 30 tablet; Refill: 0  -Start famotidine  to help with symptoms  -Pt advised to proceed to urgent care or hospital for worsening symptoms -Pt advised to definitively diagnose an stomach ulcer, he will need testing done and should also follow up with PCP  Follow Up Instructions: I discussed the  assessment and treatment plan with the patient. The patient was provided an opportunity to ask questions and all were answered. The patient agreed with the plan and demonstrated an understanding of the instructions.  A copy of instructions were sent to the patient via MyChart unless otherwise noted below.    The patient was advised to call back or seek an in-person evaluation if the symptoms worsen or if the condition fails to improve as anticipated.    Dominic Mater, PA-C

## 2024-02-25 NOTE — Patient Instructions (Signed)
 [name], thank you for joining [name], PA-C for today's virtual visit.  While this provider is not your primary care provider (PCP), if your PCP is located in our provider database this encounter information will be shared with them immediately following your visit.   A [facility] MyChart account gives you access to today's visit and all your visits, tests, and labs performed at [facility]  click here if you don't have a [facility] MyChart account or go to [email or web address]  Consent: (Patient) [name] provided verbal consent for this virtual visit at the beginning of the encounter.  Current Medications:  Current Outpatient Medications:    famotidine  (PEPCID ) 20 MG tablet, Take 1 tablet (20 mg total) by mouth at bedtime., Disp: 30 tablet, Rfl: 0   cholecalciferol (VITAMIN D3) 25 MCG (1000 UNIT) tablet, Take 2,000-3,000 Units by mouth See admin instructions. 2000 units in the morning, 3000 units at bedtime, Disp: , Rfl:    dexlansoprazole (DEXILANT) 60 MG capsule, Take 60 mg by mouth daily., Disp: , Rfl:    fenofibrate  micronized (LOFIBRA) 67 MG capsule, Take 67 mg by mouth daily before breakfast., Disp: , Rfl:    folic acid (FOLVITE) 800 MCG tablet, Take 800 mcg by mouth daily., Disp: , Rfl:    levETIRAcetam  (KEPPRA  XR) 500 MG 24 hr tablet, Take 2 tablets (1,000 mg total) by mouth daily., Disp: 180 tablet, Rfl: 3   lubiprostone  (AMITIZA ) 24 MCG capsule, Take 24 mcg by mouth 2 (two) times daily with a meal., Disp: , Rfl:    metFORMIN  (GLUCOPHAGE ) 500 MG tablet, Take 500 mg by mouth 2 (two) times daily with a meal., Disp: , Rfl:    Multiple Vitamins-Minerals (CENTRUM SILVER 50+MEN PO), Take 1 tablet by mouth daily., Disp: , Rfl:    Multiple Vitamins-Minerals (PRESERVISION AREDS PO), Take 1 capsule by mouth 2 (two) times daily., Disp: , Rfl:    simvastatin  (ZOCOR ) 40 MG tablet, Take 40 mg by mouth at bedtime., Disp: , Rfl:    Medications ordered  in this encounter:  Meds ordered this encounter  Medications   famotidine  (PEPCID ) 20 MG tablet    Sig: Take 1 tablet (20 mg total) by mouth at bedtime.    Dispense:  30 tablet    Refill:  0     *If you need refills on other medications prior to your next appointment, please contact your pharmacy*  Follow-Up: Call back or seek an in-person evaluation if the symptoms worsen or if the condition fails to improve as anticipated.  [facility] [phone]  Other Instructions GERD in Adults: What to Know  Gastroesophageal reflux (GER) is when acid from your stomach flows up into your esophagus. Your esophagus is the part of your body that moves food from your mouth to your stomach. Normally, food goes down and stays in your stomach to be digested. But with GER, food and stomach acid may go back up. You may have a disease called gastroesophageal reflux disease (GERD) if the reflux: Happens often. Causes very bad symptoms. Makes your esophagus sore and swollen. Over time, GERD can make small holes called ulcers in the lining of your esophagus. What are the causes? GERD is caused by a problem with the muscle between your esophagus and stomach. This muscle is called the lower esophageal sphincter (LES). When it's weak or not normal, it doesn't close like it should. This means food and stomach acid can go back up into your  esophagus. The muscle can be weak if: You smoke or use products with tobacco in them. You're pregnant. You have a type of hernia called a hiatal hernia. You eat certain foods and drinks. These include: Alcohol. Coffee. Chocolate. Onions. Peppermint. What increases the risk? Being overweight. Having a disease that affects your connective tissue. Taking NSAIDs, such as ibuprofen. What are the signs or symptoms? Heartburn. Trouble swallowing. Pain when you swallow. The feeling of having a lump in your throat. A bitter taste in your mouth. Bad  breath. Having an upset or bloated stomach. Burping. Chest pain. Other conditions can also cause chest pain. Make sure you see your health care provider if you have chest pain. Wheezing. This is when you make high-pitched whistling sounds when you breathe, most often when you breathe out. A long-term cough or a cough at night. How is this diagnosed? GERD may be diagnosed based on your medical history and a physical exam. You may also have tests. These may include: An endoscopy. This test looks at your stomach and esophagus with a small camera. A barium swallow test. This shows the shape and size of your esophagus and how well it's working. Tests of your esophagus to check for: Acid levels. Pressure. How is this treated? Treatment may depend on how bad your symptoms are. It may include: Changes to your diet and daily life. Medicines. Surgery. Follow these instructions at home: Eating and drinking Follow an eating plan as told by your provider. You may need to avoid certain foods and drinks. These may include: Coffee and tea, with or without caffeine. Alcohol. Energy drinks and sports drinks. Fizzy drinks or sodas. Chocolate and cocoa. Peppermint and mint flavorings. Garlic and onions. Horseradish. Spicy and acidic foods. These include: Peppers. Chili powder and curry powder. Vinegar. Hot sauces and BBQ sauce. Citrus fruits and juices. These include: Oranges. Lemons. Limes. Tomato-based foods. These include: Red sauce and pizza with red sauce. Chili. Salsa. Fried and fatty foods. These include: Donuts. French fries. Potato chips. High-fat dressings. High-fat meats. These include: Hot dogs and sausage. Rib eye steak. Ham and bacon. High-fat dairy items. These include: Whole milk. Butter. Cream cheese. Eat small meals often. Avoid eating big meals. Avoid drinking lots of liquid with your meals. Try not to eat meals during the 2-3 hours before bedtime. Try not to  lie down right after you eat. Do not exercise right after you eat. Lifestyle  If you're overweight, lose an amount of weight that's healthy for you. Ask your provider about a safe weight loss goal. Do not smoke, vape, or use nicotine or tobacco. Wear loose clothes. Do not wear things that are tight around your waist. When you sleep, try: Raising the head of your bed about 6 inches (15 cm). You can use a wedge to do this. Lying down on your left side. Try to lower your stress. If you need help doing this, ask your provider. General instructions Take your medicines only as told. Do not take aspirin  or ibuprofen unless you're told to. Watch for any changes in your symptoms. Do not bend over if it makes your symptoms worse. Contact a health care provider if: You have new symptoms. You have trouble: Drinking. Swallowing. Eating. It hurts to swallow. You have wheezing. You have a cough that won't go away. Your voice is hoarse. Your symptoms don't get better with treatment. Get help right away if: You have pain all of a sudden in your: Arm. Neck. Jaw. Teeth. Back.  You feel sweaty, dizzy, or light-headed all of a sudden. You faint. You have chest pain or shortness of breath. You vomit and the vomit is: Green, yellow, or black. Looks like blood or coffee grounds. Your poop is red, bloody, or black. These symptoms may be an emergency. Call 911 right away. Do not wait to see if the symptoms will go away. Do not drive yourself to the hospital. This information is not intended to replace advice given to you by your health care provider. Make sure you discuss any questions you have with your health care provider. Document Revised: 02/15/2023 Document Reviewed: 09/01/2022 Elsevier Patient Education  2024 Elsevier Inc.   If you have been instructed to have an in-person evaluation today at a local Urgent Care facility, please use the link below. It will take you to a list of all of  our available [facility], including address, phone number and hours of operation. Please do not delay care.  [facility]  If you or a family member do not have a primary care provider, use the link below to schedule a visit and establish care. When you choose a [facility] primary care physician or advanced practice provider, you gain a long-term partner in health. Find a Primary Care Provider  Learn more about [facility] in-office and virtual care options: [facility] - Get Care Now

## 2024-03-14 ENCOUNTER — Encounter (INDEPENDENT_AMBULATORY_CARE_PROVIDER_SITE_OTHER): Payer: Self-pay

## 2024-03-28 ENCOUNTER — Other Ambulatory Visit: Payer: Self-pay | Admitting: Family Medicine

## 2024-03-28 DIAGNOSIS — L905 Scar conditions and fibrosis of skin: Secondary | ICD-10-CM

## 2024-04-09 ENCOUNTER — Inpatient Hospital Stay: Admission: RE | Admit: 2024-04-09 | Discharge: 2024-04-09 | Attending: Family Medicine | Admitting: Family Medicine

## 2024-04-09 DIAGNOSIS — L905 Scar conditions and fibrosis of skin: Secondary | ICD-10-CM

## 2024-04-10 NOTE — Progress Notes (Signed)
" ° °  Colonoscopy completed. Please see colonoscopy note in the media or procedures tab dated 04/10/2024.  2 polyps removed. Repeat in 5 years Quinten MARLA Patricia, MD 04/10/2024 / 12:40 PM "

## 2024-04-18 ENCOUNTER — Encounter (INDEPENDENT_AMBULATORY_CARE_PROVIDER_SITE_OTHER): Payer: Self-pay

## 2024-04-18 ENCOUNTER — Ambulatory Visit (INDEPENDENT_AMBULATORY_CARE_PROVIDER_SITE_OTHER)

## 2024-04-18 VITALS — BP 105/71 | HR 76 | Temp 98.6°F | Ht 73.0 in | Wt 198.0 lb

## 2024-04-18 DIAGNOSIS — J3489 Other specified disorders of nose and nasal sinuses: Secondary | ICD-10-CM

## 2024-04-18 NOTE — Progress Notes (Signed)
 Dear Dr. Domenick, Here is my assessment for our mutual patient, [name]. Thank you for allowing me the opportunity to care for your patient. Please do not hesitate to contact me should you have any other questions. Sincerely, Dr. Hadassah Parody  Otolaryngology Clinic Note Referring provider: Dr. Domenick HPI:   Initial HPI (04/18/2024) 73 year old male with rheumatoid arthritis on immunosuppressive therapy presents for evaluation of recurrent left intranasal sore  He has had a recurrent tender lesion inside left nasal cavity-superiorly and on the left lateral nasal sidewall.  Intermittently present over the last year.  Recurs every few months and spontaneously resolves.  Last episode, he was given Bactroban and this helped it resolve quicker.  Lesion not present today.  At the time when this started, the lesion was preceded by left-sided clear rhinorrhea which lasted several days.  Rhinorrhea has since resolved.  No epistaxis no use of nasal sprays.  Never had this issue in the past.   Independent Review of Additional Tests or Records:  Referral note 03/14/2024 Lyle Bertrand, NP: Last March noted left nostril was draining and it was sore to touch.  This has happened 3-4 times last 8 months.  Recurrent redness in this area usually casting couple weeks and feels like it just has to run its course.  Given Bactroban, refer to ENT given recurrence  MRI brain 06/19/23 independently reviewed showing subtotal opacification of bilateral maxillary sinuses  PMH/Meds/All/SocHx/FamHx/ROS:   Past Medical History:  Diagnosis Date   Arthritis    Chronic kidney disease    Diabetes mellitus without complication (HCC)    Family history of prostate cancer    GERD (gastroesophageal reflux disease)    Hyperlipemia    Hypertriglyceridemia    Plantar fasciitis, bilateral    Prostate cancer (HCC)    Seizures (HCC)    started age 35-last one 73   Urinary retention    Wears glasses      Past  Surgical History:  Procedure Laterality Date   BIOPSY PROSTATE  09/19/2017   BRONCHOSCOPY  11/2008   removal vocal cord polyp   CAPSULAR RELEASE Left 10/02/2012   Procedure: LEFT ARTHROSCOPY CAPSULAR RELEASE;  Surgeon: Eva Elsie Herring, MD;  Location: [facility];  Service: Orthopedics;  Laterality: Left;   COLONOSCOPY     CYSTOSCOPY N/A 11/17/2020   Procedure: CYSTOSCOPY, URETHRAL DILATATION,FOLEY CATH PLACEMENT,BALLOON DILATATION, URETEROSCOPY;  Surgeon: Watt Rush, MD;  Location: [facility];  Service: Urology;  Laterality: N/A;   KNEE ARTHROSCOPY  08/2011   lt   POLYPECTOMY  1988   vocal cords  x2 2010   TONSILLECTOMY     as a child   TOTAL HIP ARTHROPLASTY Right 10/19/2021   Procedure: TOTAL HIP ARTHROPLASTY ANTERIOR APPROACH;  Surgeon: Yvone Rush, MD;  Location: [facility];  Service: Orthopedics;  Laterality: Right;   TRANSRECTAL ULTRASOUND  09/19/2017    Family History  Problem Relation Age of Onset   Arthritis Mother    Dementia Mother    Cancer - Prostate Father 83   Cancer - Prostate Brother 73   Alzheimer's disease Maternal Uncle    Autism Daughter      Social Connections: Not on file     Current Outpatient Medications  Medication Instructions   cholecalciferol (VITAMIN D3) 2,000-3,000 Units, See admin instructions   dexlansoprazole (DEXILANT) 60 mg, Daily   empagliflozin (JARDIANCE) 10 mg, Daily   famotidine  (PEPCID ) 20 mg, Oral, Daily at bedtime   fenofibrate  micronized (LOFIBRA) 67 mg, Daily before breakfast  folic acid (FOLVITE) 800 mcg, Daily   levETIRAcetam  (KEPPRA  XR) 1,000 mg, Oral, Daily   lubiprostone  (AMITIZA ) 24 mcg, 2 times daily with meals   metFORMIN  (GLUCOPHAGE ) 500 mg, 2 times daily with meals   Multiple Vitamins-Minerals (CENTRUM SILVER 50+MEN PO) 1 tablet, Daily   Multiple Vitamins-Minerals (PRESERVISION AREDS PO) 1 capsule, 2 times daily   simvastatin  (ZOCOR ) 40 mg, Daily at bedtime     Physical Exam:   BP 105/71 (BP  Location: Right Arm, Patient Position: Sitting)   Pulse 76   Temp 98.6 F (37 C)   Ht 6' 1 (1.854 m)   Wt 198 lb (89.8 kg)   SpO2 96%   BMI 26.12 kg/m   Salient findings:  CN II-XII intact  Anterior rhinoscopy: Septum midline anteriorly; bilateral inferior turbinates with mild hypertrophy Nasal endoscopy was indicated to better evaluate the nose and paranasal sinuses, given the patient's history and exam findings, and is detailed below.   No lesions of oral cavity/oropharynx  No obviously palpable neck masses/lymphadenopathy/thyromegaly  No respiratory distress or stridor  Seprately Identifiable Procedures:  Prior to initiating any procedures, risks/benefits/alternatives were explained to the patient and verbal consent obtained.  PROCEDURE (04/18/2024): Bilateral Diagnostic Rigid Nasal Endoscopy Pre-procedure diagnosis: Concern for nasal lesion Post-procedure diagnosis: same Indication: See pre-procedure diagnosis and physical exam above Complications: None apparent EBL: 0 mL Anesthesia: Lidocaine  4% and topical decongestant was topically sprayed in each nasal cavity  Description of Procedure:  Patient was identified. A rigid 30 degree endoscope was utilized to evaluate the sinonasal cavities, mucosa, sinus ostia and turbinates and septum.  Overall, signs of mucosal inflammation are noted.  No mucopurulence, polyps, or masses noted.   Bilateral middle meatus clear.  Bilateral sphenoid ethmoidal recess clear. Photodocumentation was obtained.  CPT CODE -- 68768 - Mod 25   Impression & Plans:  [name] is a 73 y.o. male with   1. Nasal sore     Assessment and Plan Assessment & Plan Recurrent nasal sore on left lateral nasal sidewall intranasally He experiences recurrent episodes of localized soreness in the left nasal vestibule, which resolve with topical antibiotic ointment. Etiology likely includes local dryness and minor superimposed infection, with possible  contribution from immunosuppression due to rheumatoid arthritis therapy. Current examination reveals healthy nasal mucosa without persistent or concerning lesions. - Reassured him that the nasal mucosa is healthy with no concerning findings on examination. - Advised use of Bactroban ointment for several days if recurrence occurs, as previously effective. - Instructed to contact the clinic and arrange prompt follow-up if a lesion recurs and does not resolve with Bactroban, for evaluation while active.    See below regarding exact medications prescribed this encounter including dosages and route: No orders of the defined types were placed in this encounter.   Thank you for allowing me the opportunity to care for your patient. Please do not hesitate to contact me should you have any other questions.  Sincerely, Hadassah Parody, MD Otolaryngologist (ENT), [facility] Phone: [phone] Fax: [phone]  MDM:  Level 3 Complexity/Problems addressed: 3-acute uncomplicated illness or injury Data complexity: 4-  independent review of MRI - Morbidity: Low-   - Prescription Drug prescribed or managed: No

## 2024-04-23 ENCOUNTER — Ambulatory Visit: Admitting: Family Medicine

## 2024-04-23 ENCOUNTER — Encounter: Payer: Self-pay | Admitting: Family Medicine

## 2024-04-23 VITALS — BP 132/80 | Ht 73.0 in | Wt 206.5 lb

## 2024-04-23 DIAGNOSIS — F8081 Childhood onset fluency disorder: Secondary | ICD-10-CM

## 2024-04-23 DIAGNOSIS — R269 Unspecified abnormalities of gait and mobility: Secondary | ICD-10-CM

## 2024-04-23 DIAGNOSIS — R569 Unspecified convulsions: Secondary | ICD-10-CM

## 2024-04-23 MED ORDER — LEVETIRACETAM ER 500 MG PO TB24: 1000.0000 mg | ORAL_TABLET | Freq: Every day | ORAL | 3 refills | Status: AC

## 2024-04-23 NOTE — Patient Instructions (Signed)
 Below is our plan:  We will continue levetiracetam  XR 1000mg  daily.   Please make sure you are consistent with timing of seizure medication. I recommend annual visit with primary care provider (PCP) for complete physical and routine blood work. I recommend daily intake of vitamin D (400-800iu) and calcium (800-1000mg ) for bone health. Discuss Dexa screening with PCP.   According to [place] law, you can not drive unless you are seizure / syncope free for at least 6 months and under physician's care.  Please maintain precautions. Do not participate in activities where a loss of awareness could harm you or someone else. No swimming alone, no tub bathing, no hot tubs, no driving, no operating motorized vehicles (cars, ATVs, motocycles, etc), lawnmowers, power tools or firearms. No standing at heights, such as rooftops, ladders or stairs. Avoid hot objects such as stoves, heaters, open fires. Wear a helmet when riding a bicycle, scooter, skateboard, etc. and avoid areas of traffic. Set your water heater to 120 degrees or less.  SUDEP is the sudden, unexpected death of someone with epilepsy, who was otherwise healthy. In SUDEP cases, no other cause of death is found when an autopsy is done. Each year, more than 1 in 1,000 people with epilepsy die from SUDEP. This is the leading cause of death in people with uncontrolled seizures. Until further answers are available, the best way to prevent SUDEP is to lower your risk by controlling seizures. Research has found that people with all types of epilepsy that experience convulsive seizures can be at risk.  Please make sure you are staying well hydrated. I recommend 50-60 ounces daily. Well balanced diet and regular exercise encouraged. Consistent sleep schedule with 6-8 hours recommended.   Please continue follow up with care team as directed.   Follow up with me in 1 year   You may receive a survey regarding today's visit. I encourage you to leave honest feed back  as I do use this information to improve patient care. Thank you for seeing me today!
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
## 2025-05-06 ENCOUNTER — Ambulatory Visit: Admitting: Family Medicine
